# Patient Record
Sex: Female | Born: 1960 | Race: Black or African American | Hispanic: No | Marital: Married | Smoking: Never smoker
Health system: Southern US, Community
[De-identification: ages and names within clinical notes are randomized; demographics above are authoritative.]

## PROBLEM LIST (undated history)

## (undated) DIAGNOSIS — Z9109 Other allergy status, other than to drugs and biological substances: Secondary | ICD-10-CM

## (undated) DIAGNOSIS — R87619 Unspecified abnormal cytological findings in specimens from cervix uteri: Secondary | ICD-10-CM

## (undated) DIAGNOSIS — IMO0002 Reserved for concepts with insufficient information to code with codable children: Secondary | ICD-10-CM

## (undated) DIAGNOSIS — D869 Sarcoidosis, unspecified: Secondary | ICD-10-CM

## (undated) DIAGNOSIS — Z8742 Personal history of other diseases of the female genital tract: Secondary | ICD-10-CM

## (undated) DIAGNOSIS — C801 Malignant (primary) neoplasm, unspecified: Secondary | ICD-10-CM

## (undated) DIAGNOSIS — N809 Endometriosis, unspecified: Secondary | ICD-10-CM

## (undated) DIAGNOSIS — Z8719 Personal history of other diseases of the digestive system: Secondary | ICD-10-CM

## (undated) HISTORY — DX: Personal history of other diseases of the digestive system: Z87.19

## (undated) HISTORY — DX: Unspecified abnormal cytological findings in specimens from cervix uteri: R87.619

## (undated) HISTORY — DX: Reserved for concepts with insufficient information to code with codable children: IMO0002

## (undated) HISTORY — PX: LAPAROSCOPY: SHX197

## (undated) HISTORY — PX: CYSTECTOMY: SUR359

## (undated) HISTORY — DX: Other allergy status, other than to drugs and biological substances: Z91.09

## (undated) HISTORY — DX: Sarcoidosis, unspecified: D86.9

## (undated) HISTORY — DX: Personal history of other diseases of the female genital tract: Z87.42

## (undated) HISTORY — DX: Endometriosis, unspecified: N80.9

## (undated) HISTORY — DX: Malignant (primary) neoplasm, unspecified: C80.1

---

## 1990-04-01 HISTORY — PX: DILATION AND CURETTAGE OF UTERUS: SHX78

## 1997-10-17 ENCOUNTER — Other Ambulatory Visit: Admission: RE | Admit: 1997-10-17 | Discharge: 1997-10-17 | Payer: Self-pay | Admitting: Obstetrics & Gynecology

## 1999-03-30 ENCOUNTER — Other Ambulatory Visit: Admission: RE | Admit: 1999-03-30 | Discharge: 1999-03-30 | Payer: Self-pay | Admitting: Obstetrics & Gynecology

## 2000-09-10 ENCOUNTER — Other Ambulatory Visit: Admission: RE | Admit: 2000-09-10 | Discharge: 2000-09-10 | Payer: Self-pay | Admitting: Obstetrics and Gynecology

## 2002-01-20 ENCOUNTER — Other Ambulatory Visit: Admission: RE | Admit: 2002-01-20 | Discharge: 2002-01-20 | Payer: Self-pay | Admitting: Obstetrics and Gynecology

## 2003-04-13 ENCOUNTER — Other Ambulatory Visit: Admission: RE | Admit: 2003-04-13 | Discharge: 2003-04-13 | Payer: Self-pay | Admitting: Obstetrics and Gynecology

## 2003-08-09 ENCOUNTER — Ambulatory Visit (HOSPITAL_COMMUNITY): Admission: RE | Admit: 2003-08-09 | Discharge: 2003-08-09 | Payer: Self-pay

## 2004-06-21 ENCOUNTER — Ambulatory Visit: Payer: Self-pay | Admitting: Unknown Physician Specialty

## 2004-06-27 ENCOUNTER — Ambulatory Visit: Payer: Self-pay | Admitting: Unknown Physician Specialty

## 2004-07-04 ENCOUNTER — Ambulatory Visit: Payer: Self-pay | Admitting: Unknown Physician Specialty

## 2004-08-06 ENCOUNTER — Other Ambulatory Visit: Payer: Self-pay

## 2004-08-06 ENCOUNTER — Ambulatory Visit: Payer: Self-pay | Admitting: Specialist

## 2004-08-10 ENCOUNTER — Ambulatory Visit: Payer: Self-pay | Admitting: Specialist

## 2005-06-19 ENCOUNTER — Other Ambulatory Visit: Admission: RE | Admit: 2005-06-19 | Discharge: 2005-06-19 | Payer: Self-pay | Admitting: Obstetrics and Gynecology

## 2005-08-14 ENCOUNTER — Ambulatory Visit: Payer: Self-pay | Admitting: Specialist

## 2007-05-15 IMAGING — CT CT CHEST W/ CM
2 series · 15 of 31 positions shown, 19 images · IV contrast (agent unspecified)
Comparison: none

REASON FOR EXAM: Sarcoidosis
COMMENTS:

PROCEDURE:     CT  - CT CHEST WITH CONTRAST  - August 14, 2005  [DATE]
RESULT:     The patient has a history of sarcoidosis.
TECHNIQUE: CT scan of chest is performed with contrast.
Comparison is made to a prior chest CT of 07/04/2004.

[Series 3: soft tissue · axial · 0.75mm/px · z∈[+240,+290]mm · 2 of 62 slices shown]
[im 5/62  mediastinal]
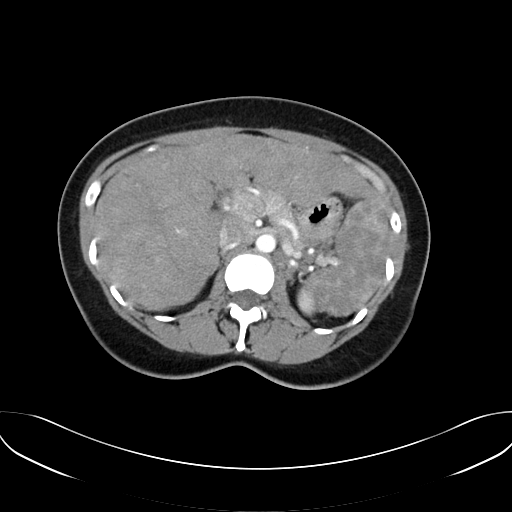
[im 15/62  mediastinal]
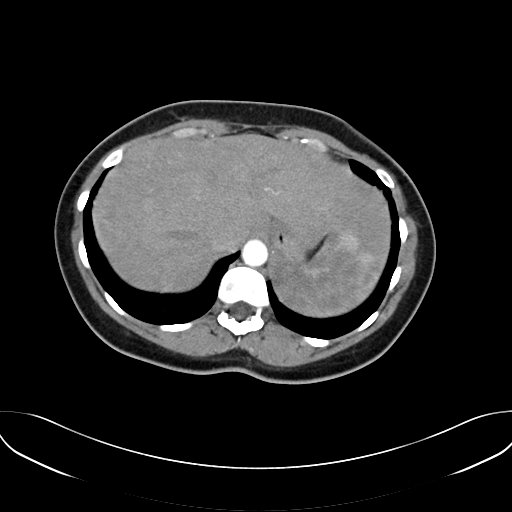

[Series 4: lung windows · axial · 0.75mm/px · z∈[+250,+500]mm · 13 of 60 slices shown, 17 images]
[im 5/60  mediastinal]
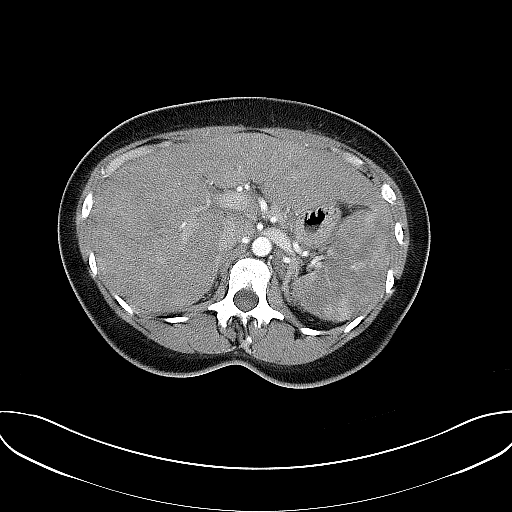
[im 5/60  lung]
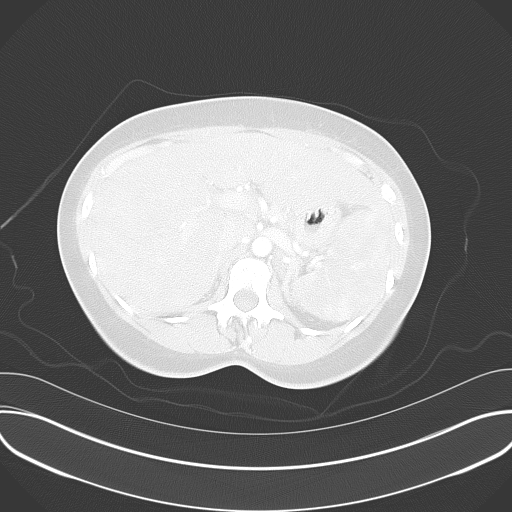
[im 10/60  lung]
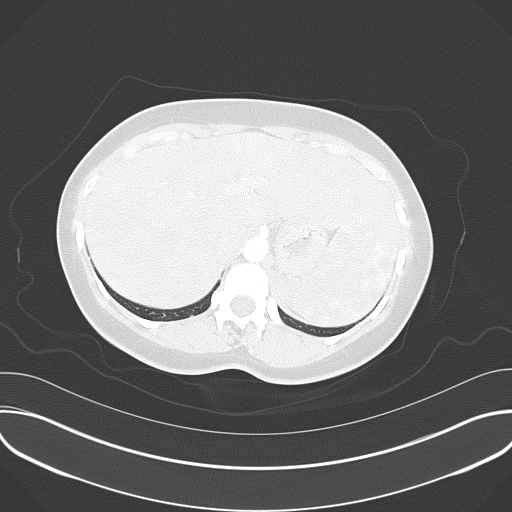
[im 14/60  lung]
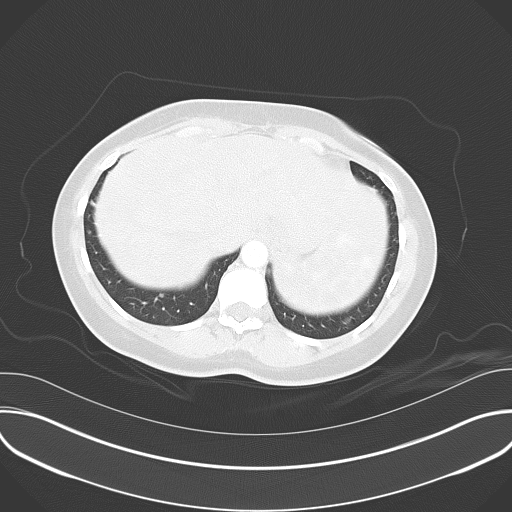
[im 19/60  lung]
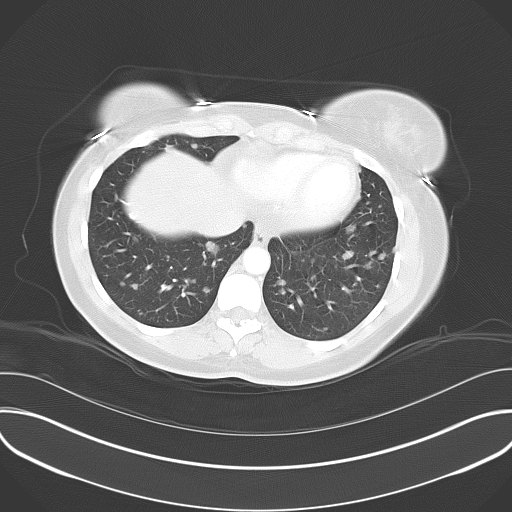
[im 23/60  mediastinal]
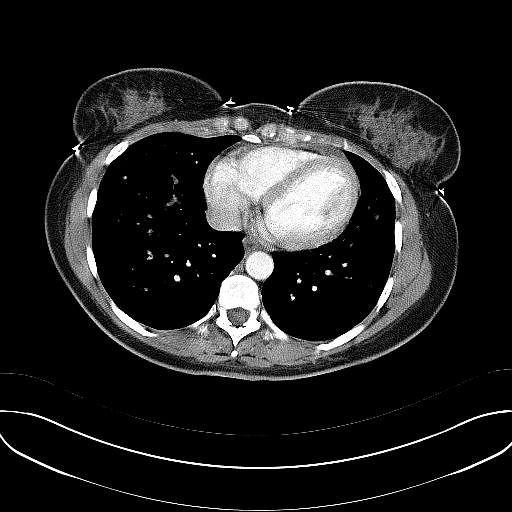
[im 23/60  lung]
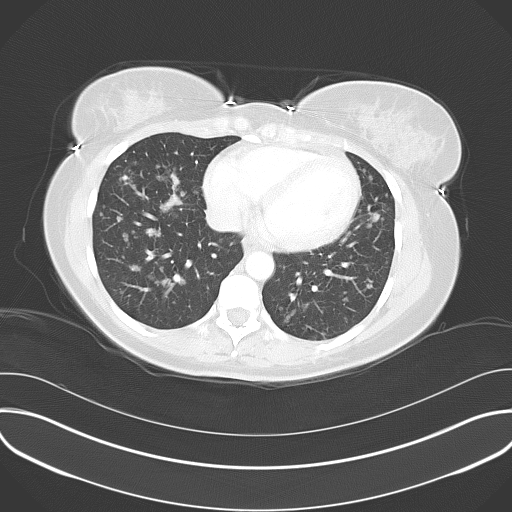
[im 28/60  lung]
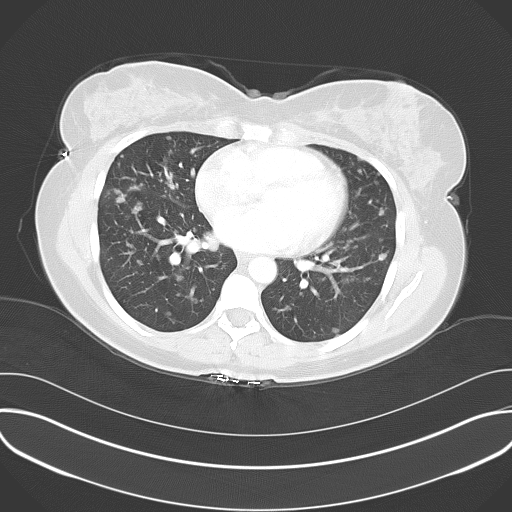
[im 30/60  lung]
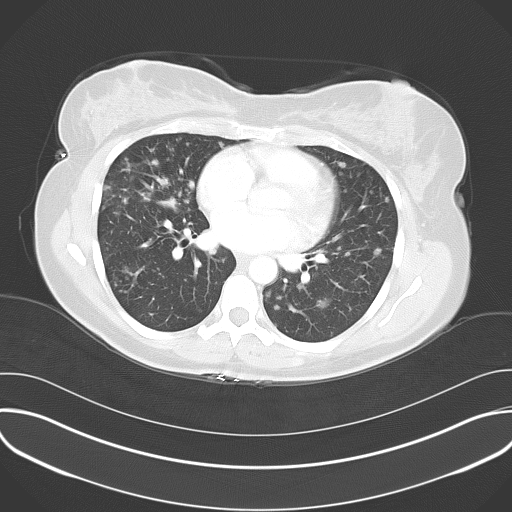
[im 32/60  lung]
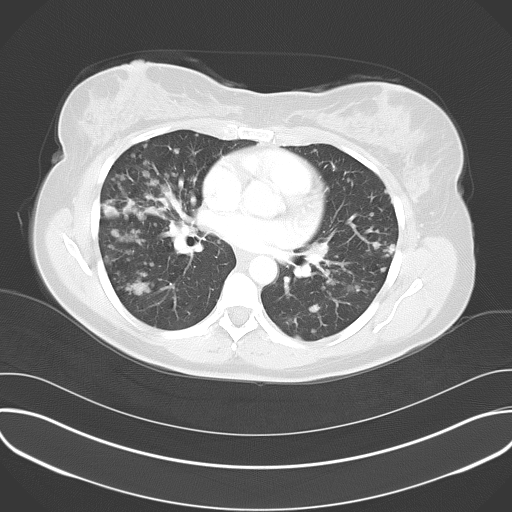
[im 37/60  mediastinal]
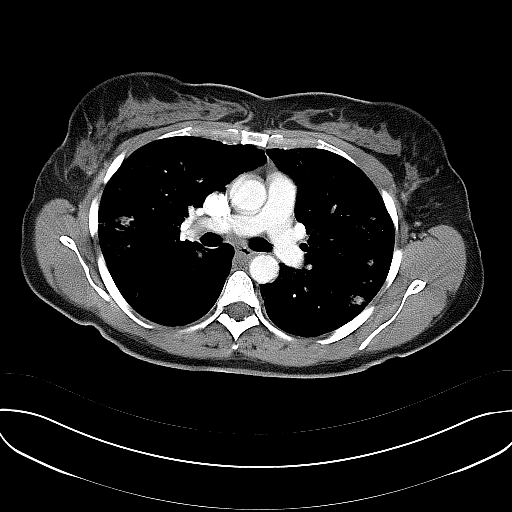
[im 37/60  lung]
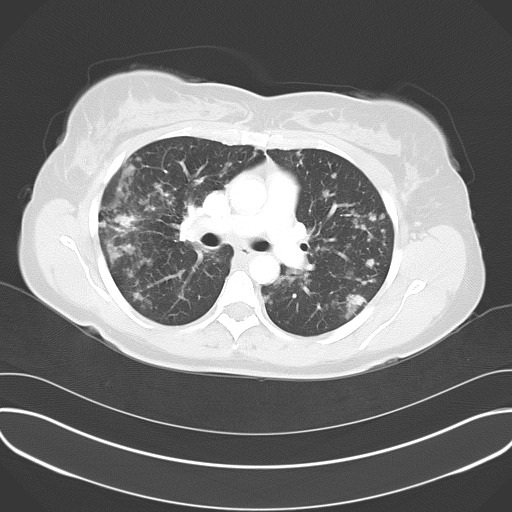
[im 41/60  lung]
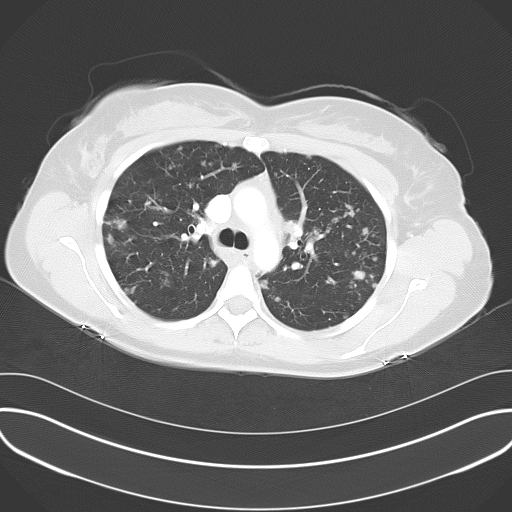
[im 46/60  lung]
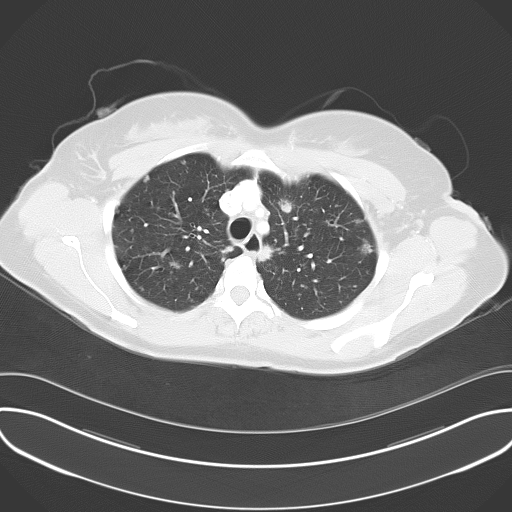
[im 50/60  lung]
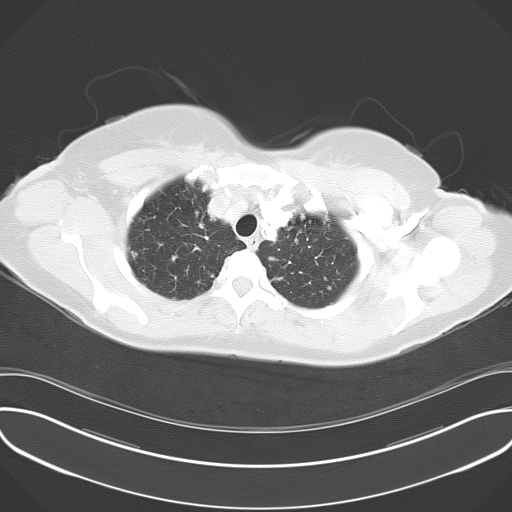
[im 55/60  mediastinal]
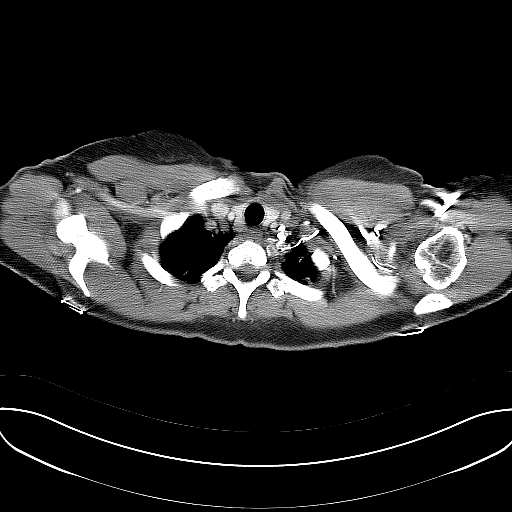
[im 55/60  lung]
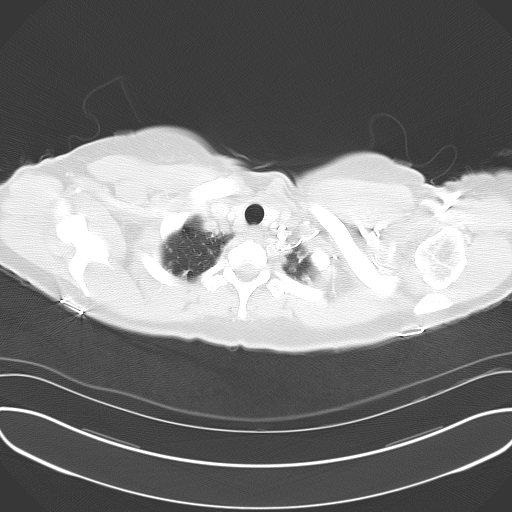

[15 of 31 positions shown; findings below may reference images not displayed]

FINDINGS: There are noted multiple, bilateral pulmonary nodules. The
pulmonary nodules have increased in size and number from the prior exam. An
alveolar component may be present. No pathologic mediastinal or hilar
lymphadenopathy is noted using CT size criteria. Innumerable splenic lesions
are noted. The patient has a known history of sarcoidosis and the above
described changes could be related to sarcoidosis. Superimposed infection or
other granulomatous disease cannot be excluded nor could other pulmonary
inflammatory diseases. Malignancy also cannot be excluded. This would
include metastatic disease.
IMPRESSION: Findings consistent with progressive sarcoidosis given the
patient's history.

## 2008-12-21 ENCOUNTER — Ambulatory Visit (HOSPITAL_COMMUNITY): Admission: RE | Admit: 2008-12-21 | Discharge: 2008-12-21 | Payer: Self-pay | Admitting: Obstetrics and Gynecology

## 2008-12-21 ENCOUNTER — Encounter (INDEPENDENT_AMBULATORY_CARE_PROVIDER_SITE_OTHER): Payer: Self-pay | Admitting: Obstetrics and Gynecology

## 2008-12-28 ENCOUNTER — Ambulatory Visit: Admission: RE | Admit: 2008-12-28 | Discharge: 2008-12-28 | Payer: Self-pay | Admitting: Gynecologic Oncology

## 2008-12-30 DIAGNOSIS — Z8541 Personal history of malignant neoplasm of cervix uteri: Secondary | ICD-10-CM | POA: Insufficient documentation

## 2009-02-15 ENCOUNTER — Encounter (INDEPENDENT_AMBULATORY_CARE_PROVIDER_SITE_OTHER): Payer: Self-pay | Admitting: Obstetrics and Gynecology

## 2009-02-15 ENCOUNTER — Ambulatory Visit (HOSPITAL_COMMUNITY): Admission: RE | Admit: 2009-02-15 | Discharge: 2009-02-16 | Payer: Self-pay | Admitting: Obstetrics and Gynecology

## 2009-04-01 DIAGNOSIS — C801 Malignant (primary) neoplasm, unspecified: Secondary | ICD-10-CM

## 2009-04-01 HISTORY — PX: ABDOMINAL HYSTERECTOMY: SHX81

## 2009-04-01 HISTORY — DX: Malignant (primary) neoplasm, unspecified: C80.1

## 2010-07-04 LAB — CBC
HCT: 37.3 % (ref 36.0–46.0)
HCT: 39 % (ref 36.0–46.0)
Hemoglobin: 12.8 g/dL (ref 12.0–15.0)
MCHC: 34.3 g/dL (ref 30.0–36.0)
MCV: 93 fL (ref 78.0–100.0)
Platelets: 256 10*3/uL (ref 150–400)
RDW: 13.9 % (ref 11.5–15.5)
RDW: 13.9 % (ref 11.5–15.5)
WBC: 11.7 10*3/uL — ABNORMAL HIGH (ref 4.0–10.5)

## 2010-07-04 LAB — COMPREHENSIVE METABOLIC PANEL
ALT: 57 U/L — ABNORMAL HIGH (ref 0–35)
Alkaline Phosphatase: 141 U/L — ABNORMAL HIGH (ref 39–117)
Calcium: 8.6 mg/dL (ref 8.4–10.5)
Chloride: 106 mEq/L (ref 96–112)
Creatinine, Ser: 0.66 mg/dL (ref 0.4–1.2)
GFR calc Af Amer: >60 mL/min (ref >60)
GFR calc non Af Amer: >60 mL/min (ref >60)
Potassium: 3.3 mEq/L — ABNORMAL LOW (ref 3.5–5.1)
Sodium: 135 mEq/L (ref 135–145)
Total Protein: 6.7 g/dL (ref 6.0–8.3)

## 2010-07-04 LAB — DIFFERENTIAL
Basophils Relative: 1 % (ref 0–1)
Lymphocytes Relative: 23 % (ref 12–46)
Lymphs Abs: 1.3 10*3/uL (ref 0.7–4.0)
Neutrophils Relative %: 64 % (ref 43–77)

## 2010-07-06 LAB — CBC
Hemoglobin: 14.2 g/dL (ref 12.0–15.0)
MCHC: 33.6 g/dL (ref 30.0–36.0)
MCV: 92.8 fL (ref 78.0–100.0)
Platelets: 268 10*3/uL (ref 150–400)

## 2010-11-14 IMAGING — CR DG CHEST 2V
2 series · 2 of 2 positions shown · non-contrast
Comparison: None

CLINICAL DATA: Cervical cancer, preoperative examination for
hysterectomy.  History of sarcoidosis.

CHEST - 2 VIEW

[view not recorded (1 of 2)]
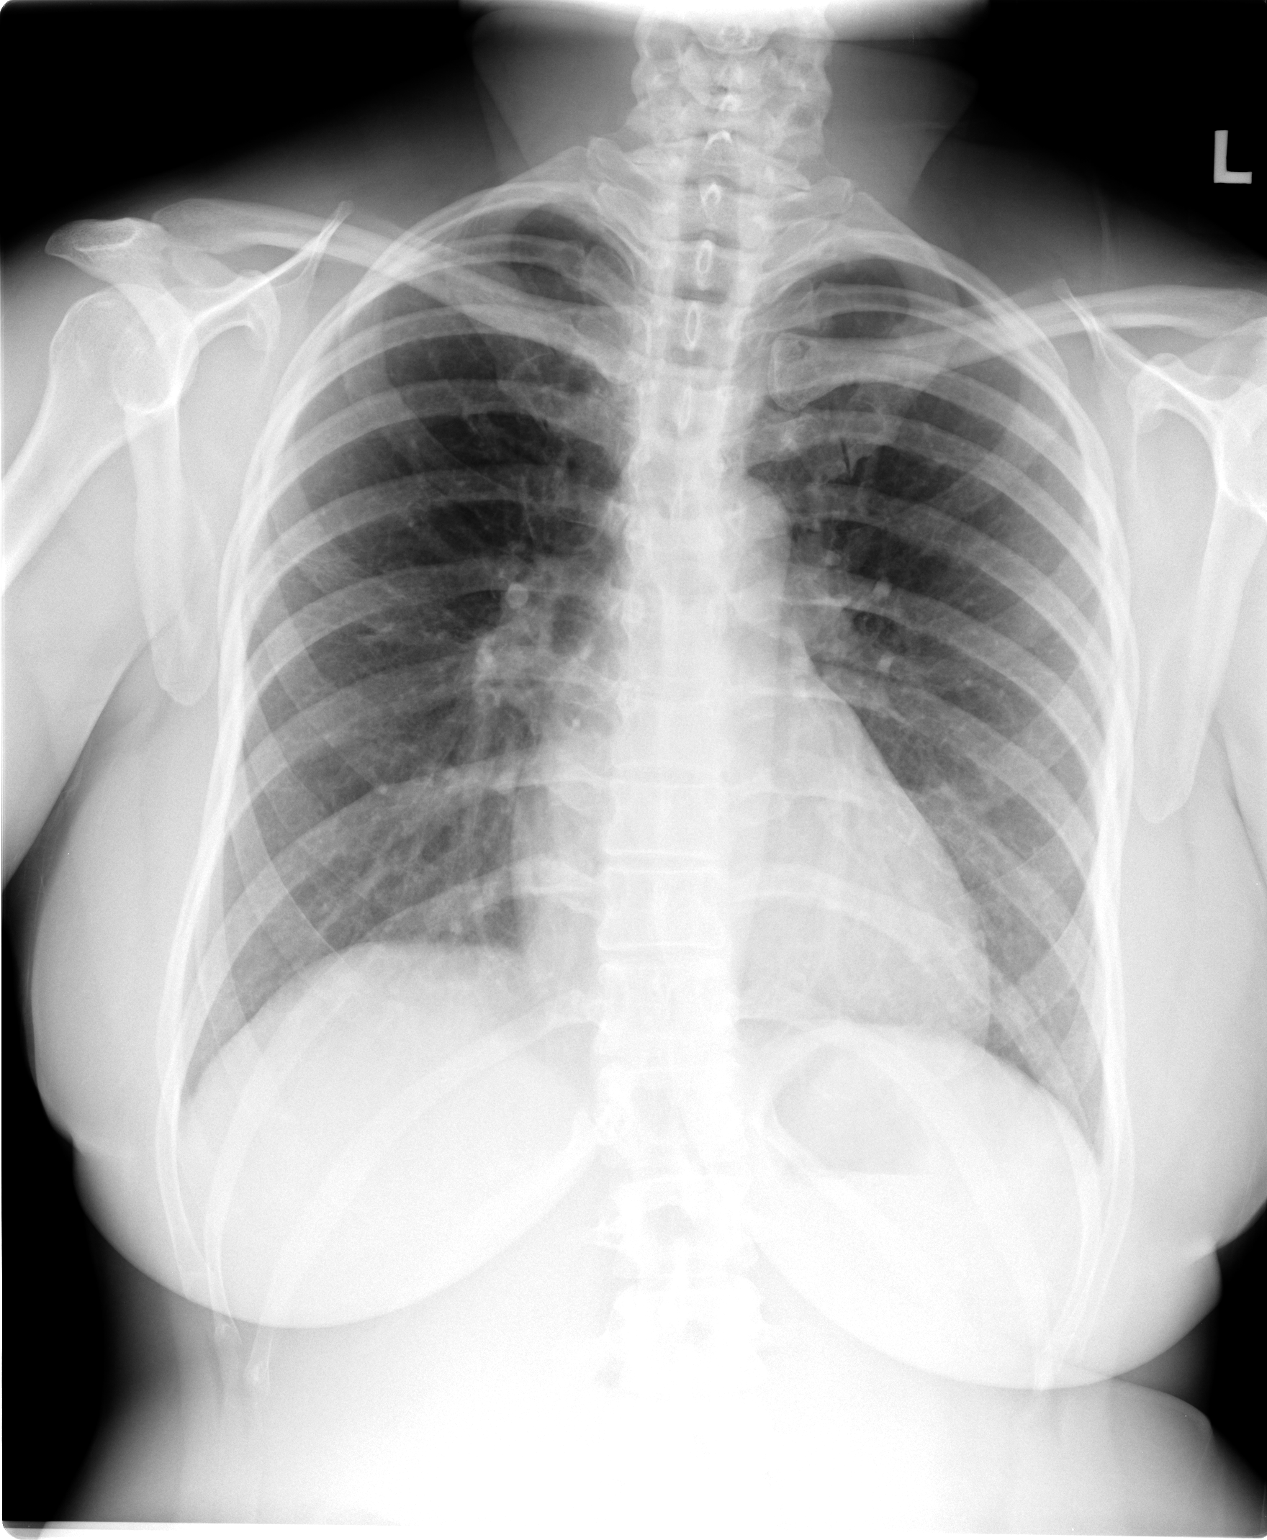

[view not recorded (2 of 2)]
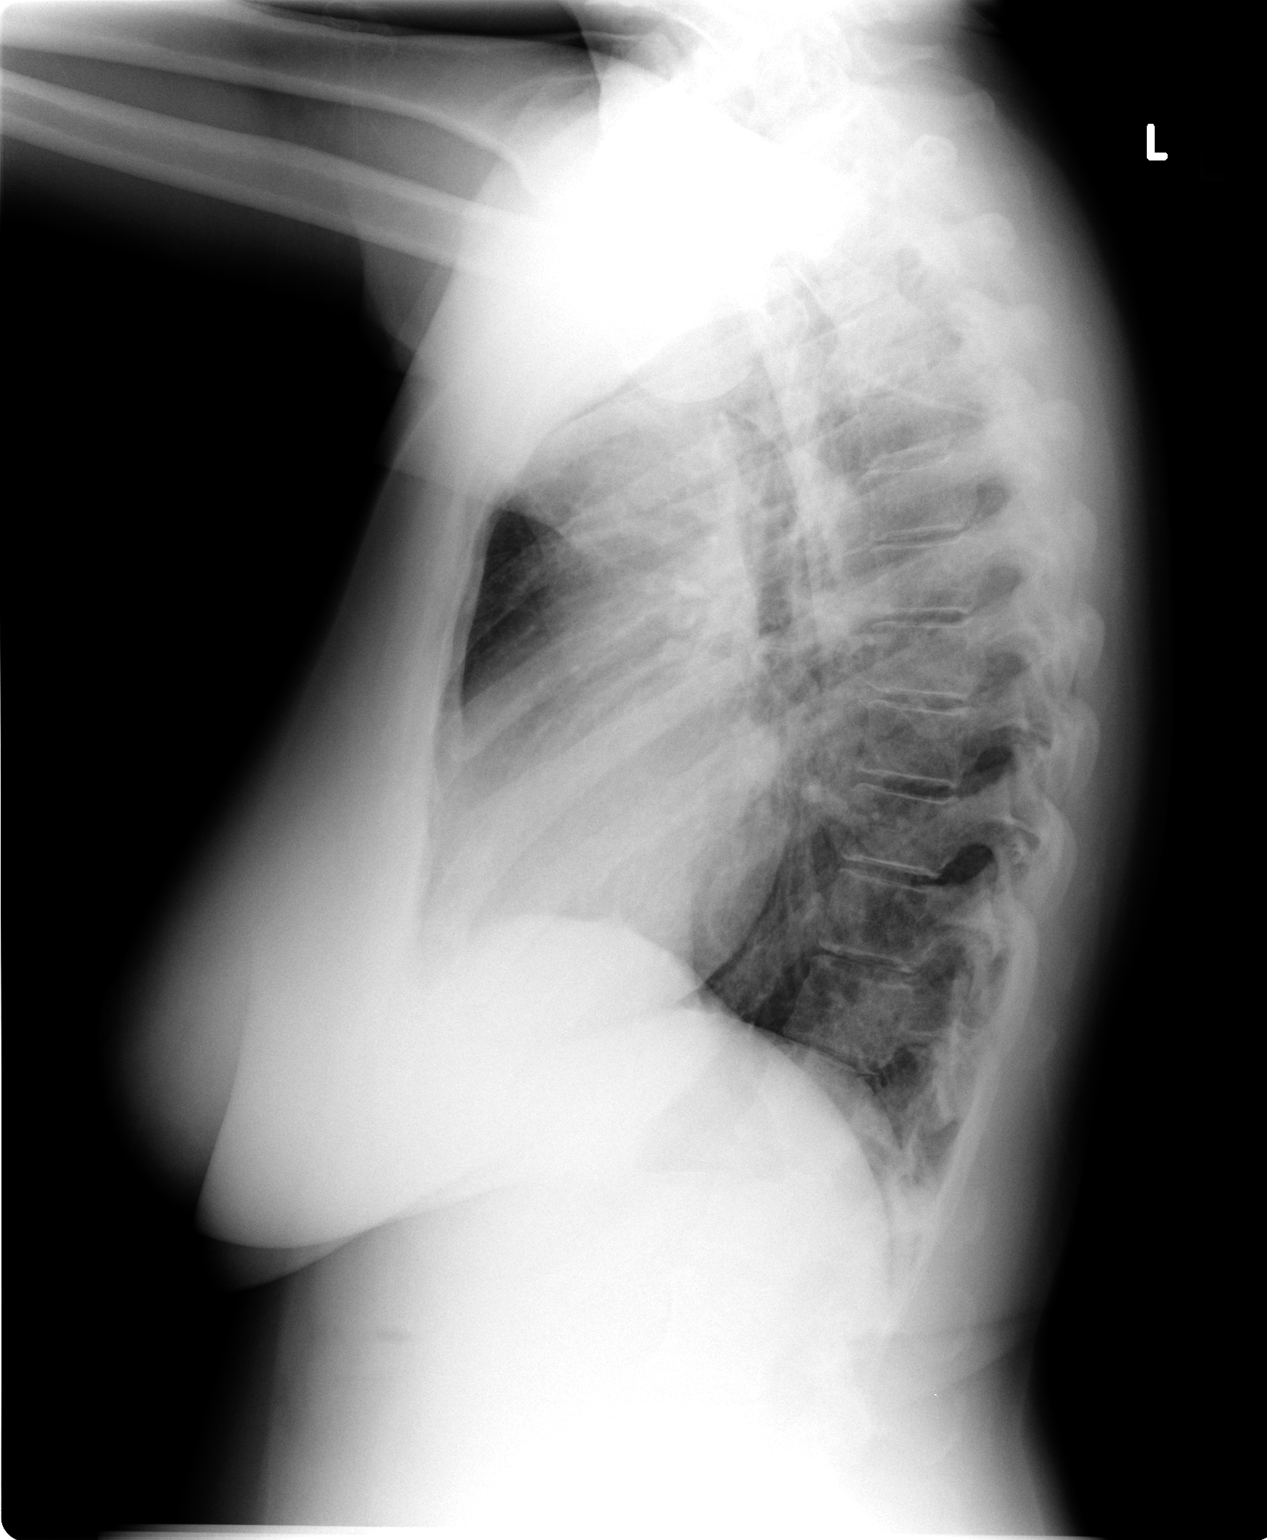

[2 of 2 positions shown; findings below may reference images not displayed]

FINDINGS: The heart size and mediastinal contours are within
normal limits.  Both lungs are clear.  The visualized skeletal
structures are unremarkable.
IMPRESSION: No active cardiopulmonary disease.

## 2011-05-29 ENCOUNTER — Ambulatory Visit: Payer: Managed Care, Other (non HMO) | Admitting: Obstetrics and Gynecology

## 2011-08-07 ENCOUNTER — Telehealth: Payer: Self-pay | Admitting: Obstetrics and Gynecology

## 2011-08-08 NOTE — Telephone Encounter (Signed)
Tc from pt. Appt sched 09-03-11@3 :45p with vph. Pt voices understanding. Pt denies any complaints at this time.

## 2011-08-08 NOTE — Telephone Encounter (Signed)
Lm on vm to cb per telephone call.  

## 2011-09-02 DIAGNOSIS — Z8742 Personal history of other diseases of the female genital tract: Secondary | ICD-10-CM | POA: Insufficient documentation

## 2011-09-02 DIAGNOSIS — N809 Endometriosis, unspecified: Secondary | ICD-10-CM | POA: Insufficient documentation

## 2011-09-02 DIAGNOSIS — Z9109 Other allergy status, other than to drugs and biological substances: Secondary | ICD-10-CM | POA: Insufficient documentation

## 2011-09-02 DIAGNOSIS — IMO0002 Reserved for concepts with insufficient information to code with codable children: Secondary | ICD-10-CM | POA: Insufficient documentation

## 2011-09-02 DIAGNOSIS — Z8719 Personal history of other diseases of the digestive system: Secondary | ICD-10-CM | POA: Insufficient documentation

## 2011-09-03 ENCOUNTER — Encounter: Payer: Managed Care, Other (non HMO) | Admitting: Obstetrics and Gynecology

## 2011-12-31 ENCOUNTER — Encounter: Payer: Self-pay | Admitting: Obstetrics and Gynecology

## 2011-12-31 ENCOUNTER — Ambulatory Visit (INDEPENDENT_AMBULATORY_CARE_PROVIDER_SITE_OTHER): Payer: Managed Care, Other (non HMO) | Admitting: Obstetrics and Gynecology

## 2011-12-31 VITALS — BP 122/68 | Ht 62.5 in | Wt 164.0 lb

## 2011-12-31 DIAGNOSIS — Z8541 Personal history of malignant neoplasm of cervix uteri: Secondary | ICD-10-CM

## 2011-12-31 DIAGNOSIS — C539 Malignant neoplasm of cervix uteri, unspecified: Secondary | ICD-10-CM

## 2011-12-31 NOTE — Patient Instructions (Signed)

## 2011-12-31 NOTE — Progress Notes (Signed)
Last Pap: 11/22/2010 WNL: Yes Regular Periods:no Contraception: Hysterectomy  Monthly Breast exam:no Tetanus<17yrs:yes Nl.Bladder Function:yes Daily BMs:yes Healthy Diet:yes Calcium:no Mammogram:yes Date of Mammogram: 02/25/2011 Exercise:yes Have often Exercise: 3 times weekly Seatbelt: yes Abuse at home: no Stressful work:yes Sigmoid-colonoscopy: 2010 Bone Density: No PCP: Dr. Viann Shove Change in PMH: None Change in [facility]: None  Subjective:    NEZIAH French is a 51 y.o. female G3P2 who presents for annual exam.  The patient has no complaints today except mild vasomotor sx.  The following portions of the patient's history were reviewed and updated as appropriate: allergies, current medications, past family history, past medical history, past social history, past surgical history and problem list.  Review of Systems Pertinent items are noted in HPI. Gastrointestinal:No change in bowel habits, no abdominal pain, no rectal bleeding Genitourinary:negative for dysuria, frequency, hematuria, nocturia and urinary incontinence    Objective:     BP 122/68  Ht 5' 2.5" (1.588 m)  Wt 164 lb (74.39 kg)  BMI 29.52 kg/m2  Weight:  Wt Readings from Last 1 Encounters:  12/31/11 164 lb (74.39 kg)     BMI: Body mass index is 29.52 kg/(m^2). General Appearance: Alert, appropriate appearance for age. No acute distress HEENT: Grossly normal Neck / Thyroid: Supple, no masses, nodes or enlargement Lungs: clear to auscultation bilaterally Back: No CVA tenderness Breast Exam: No masses or nodes.No dimpling, nipple retraction or discharge. Cardiovascular: Regular rate and rhythm. S1, S2, no murmur Gastrointestinal: Soft, non-tender, no masses or organomegaly Pelvic Exam: External genitalia: normal general appearance Vaginal: normal rugae and vaginal vault, Well suspended Cervix: removed surgically Adnexa: non palpable Uterus: removed surgically Rectovaginal: normal rectal, no  masses Lymphatic Exam: Non-palpable nodes in neck, clavicular, axillary, or inguinal regions Skin: no rash or abnormalities Neurologic: Normal gait and speech, no tremor  Psychiatric: Alert and oriented, appropriate affect.    Urinalysis:Not done    Assessment:    s/p TVH for cervical cancer  Mild vasomotor sx   Plan:   mammogram pap smear return annually or prn Follow-up:  in 6 month(s) for pap

## 2012-01-02 LAB — PAP IG W/ RFLX HPV ASCU

## 2012-06-01 ENCOUNTER — Ambulatory Visit: Payer: Self-pay | Admitting: Internal Medicine

## 2012-06-19 ENCOUNTER — Ambulatory Visit: Payer: Self-pay | Admitting: Internal Medicine

## 2012-07-23 ENCOUNTER — Encounter: Payer: Self-pay | Admitting: *Deleted

## 2012-08-10 ENCOUNTER — Ambulatory Visit (INDEPENDENT_AMBULATORY_CARE_PROVIDER_SITE_OTHER): Payer: Managed Care, Other (non HMO) | Admitting: Internal Medicine

## 2012-08-10 ENCOUNTER — Encounter: Payer: Self-pay | Admitting: Internal Medicine

## 2012-08-10 VITALS — BP 120/70 | HR 75 | Temp 97.8°F | Ht 62.0 in | Wt 165.0 lb

## 2012-08-10 DIAGNOSIS — C539 Malignant neoplasm of cervix uteri, unspecified: Secondary | ICD-10-CM

## 2012-08-10 DIAGNOSIS — Z13220 Encounter for screening for lipoid disorders: Secondary | ICD-10-CM

## 2012-08-10 DIAGNOSIS — Z9109 Other allergy status, other than to drugs and biological substances: Secondary | ICD-10-CM

## 2012-08-10 DIAGNOSIS — D869 Sarcoidosis, unspecified: Secondary | ICD-10-CM

## 2012-08-10 NOTE — Progress Notes (Signed)
  Subjective:    Patient ID: Diane French, female    DOB: 08-Mar-1961, 52 y.o.   MRN: [id number]  HPI 52 year old female with past history of allergies, sarcoidosis and cervical cancer.  She comes in today to follow up on these issues as well as to establish care.  She is followed by [facility] - [facility]. History of cervical cancer.  Had surgery and required no other treatment.  Also gets her mammograms through [place].  Sees Dr Meredeth Ide for sarcoid.  No sob.  No increased cough or congestion.  No chest pain or tightness.  Sees Dr [place] Callas.  Planning to start allergy injections.  Previous colonoscopy - ok.    Past Medical History  Diagnosis Date  . Hx of dysmenorrhea   . Abnormal Pap smear     HSIL  . Endometriosis   . H/O hemorrhoids   . Sarcoidosis   . Cancer 2011    cervical cancer  . Environmental allergies     Outpatient Encounter Prescriptions as of 08/10/2012  Medication Sig Dispense Refill  . levocetirizine (XYZAL) 5 MG tablet Take 5 mg by mouth every evening.       No facility-administered encounter medications on file as of 08/10/2012.    Review of Systems Patient denies any headache, lightheadedness or dizziness.  No significant sinus symptoms now.  No chest pain, tightness or palpitations.  No increased shortness of breath, cough or congestion. No acid reflux.  No nausea or vomiting.  No abdominal pain or cramping.  No bowel change, such as diarrhea, constipation, BRBPR or melana.  No urine change.  Planning to start allergy injections.  Sees Dr [place] Callas.  On xyzal.  Colonoscopy approximately five years - ok.  Up to date with pap/pelvic exams.  Just received notification - due for her mammogram.       Objective:   Physical Exam Filed Vitals:   08/10/12 1608  BP: 120/70  Pulse: 75  Temp: 97.8 F (67.50 C)   53 year old female in no acute distress.   HEENT:  Nares- clear.  Oropharynx - without lesions. NECK:  Supple.  Nontender.  No audible bruit.  SKIN:  Well healed  excision site right lateral neck.  HEART:  Appears to be regular. LUNGS:  No crackles or wheezing audible.  Respirations even and unlabored.  RADIAL PULSE:  Equal bilaterally.  ABDOMEN:  Soft, nontender.  Bowel sounds present and normal.  No audible abdominal bruit.   EXTREMITIES:  No increased edema present.  DP pulses palpable and equal bilaterally.          Assessment & Plan:  FATIGUE.  Did report some fatigue.  Overall doing well.  Check cbc, met c and tsh.   HEALTH MAINTENANCE.  Up to date with pelvic/pap smears and breast exams.  Due for her mammogram.  Planning to schedule.  Colonoscopy approximately five years ago.  Negative.   I spent 45 minutes regarding the patient and visit with more than 50% of the time spent in consultation regarding the above.

## 2012-08-10 NOTE — Assessment & Plan Note (Signed)
Sees Dr [place] Callas.  Planning to start allergy injections.  On xyzal.

## 2012-08-10 NOTE — Assessment & Plan Note (Signed)
S/p hysterectomy.  Followed by GYN.

## 2012-08-10 NOTE — Assessment & Plan Note (Signed)
Followed by Dr Meredeth Ide.  Obtain records. Currently doing well.

## 2012-08-25 ENCOUNTER — Other Ambulatory Visit (INDEPENDENT_AMBULATORY_CARE_PROVIDER_SITE_OTHER): Payer: Managed Care, Other (non HMO)

## 2012-08-25 DIAGNOSIS — D869 Sarcoidosis, unspecified: Secondary | ICD-10-CM

## 2012-08-25 DIAGNOSIS — Z13220 Encounter for screening for lipoid disorders: Secondary | ICD-10-CM

## 2012-08-25 LAB — CBC WITH DIFFERENTIAL/PLATELET
Basophils Absolute: 0 10*3/uL (ref 0.0–0.1)
Basophils Relative: 0.7 % (ref 0.0–3.0)
Eosinophils Relative: 4 % (ref 0.0–5.0)
HCT: 40.9 % (ref 36.0–46.0)
Lymphs Abs: 2 10*3/uL (ref 0.7–4.0)
MCV: 90 fl (ref 78.0–100.0)
Monocytes Absolute: 0.6 10*3/uL (ref 0.1–1.0)
Platelets: 248 10*3/uL (ref 150.0–400.0)
RBC: 4.55 Mil/uL (ref 3.87–5.11)
WBC: 6.7 10*3/uL (ref 4.5–10.5)

## 2012-08-25 LAB — LIPID PANEL: Total CHOL/HDL Ratio: 2

## 2012-08-25 LAB — COMPREHENSIVE METABOLIC PANEL
ALT: 42 U/L — ABNORMAL HIGH (ref 0–35)
AST: 37 U/L (ref 0–37)
Albumin: 3.7 g/dL (ref 3.5–5.2)
Calcium: 8.9 mg/dL (ref 8.4–10.5)
Creatinine, Ser: 0.8 mg/dL (ref 0.4–1.2)
Sodium: 140 mEq/L (ref 135–145)

## 2012-08-26 ENCOUNTER — Other Ambulatory Visit: Payer: Self-pay | Admitting: Internal Medicine

## 2012-08-26 ENCOUNTER — Encounter: Payer: Self-pay | Admitting: *Deleted

## 2012-08-26 DIAGNOSIS — R945 Abnormal results of liver function studies: Secondary | ICD-10-CM

## 2012-08-26 DIAGNOSIS — R7989 Other specified abnormal findings of blood chemistry: Secondary | ICD-10-CM

## 2012-08-26 NOTE — Progress Notes (Signed)
Follow up liver panel ordered.

## 2012-09-08 ENCOUNTER — Other Ambulatory Visit: Payer: Managed Care, Other (non HMO)

## 2012-09-09 ENCOUNTER — Other Ambulatory Visit (INDEPENDENT_AMBULATORY_CARE_PROVIDER_SITE_OTHER): Payer: Managed Care, Other (non HMO)

## 2012-09-09 DIAGNOSIS — R7989 Other specified abnormal findings of blood chemistry: Secondary | ICD-10-CM

## 2012-09-09 DIAGNOSIS — R945 Abnormal results of liver function studies: Secondary | ICD-10-CM

## 2012-09-10 LAB — HEPATIC FUNCTION PANEL
AST: 29 U/L (ref 0–37)
Bilirubin, Direct: 0.1 mg/dL (ref 0.0–0.3)
Total Bilirubin: 1 mg/dL (ref 0.3–1.2)
Total Protein: 7.2 g/dL (ref 6.0–8.3)

## 2012-09-11 ENCOUNTER — Encounter: Payer: Self-pay | Admitting: Family Medicine

## 2012-09-16 ENCOUNTER — Encounter: Payer: Self-pay | Admitting: *Deleted

## 2012-09-16 ENCOUNTER — Other Ambulatory Visit: Payer: Self-pay | Admitting: Internal Medicine

## 2012-09-16 DIAGNOSIS — R748 Abnormal levels of other serum enzymes: Secondary | ICD-10-CM

## 2012-09-16 NOTE — Progress Notes (Signed)
Order placed for f/u liver panel.  

## 2012-10-15 ENCOUNTER — Encounter: Payer: Self-pay | Admitting: Internal Medicine

## 2013-02-11 ENCOUNTER — Ambulatory Visit: Payer: Managed Care, Other (non HMO) | Admitting: Internal Medicine

## 2013-02-26 ENCOUNTER — Encounter: Payer: Self-pay | Admitting: Internal Medicine

## 2013-02-26 ENCOUNTER — Ambulatory Visit (INDEPENDENT_AMBULATORY_CARE_PROVIDER_SITE_OTHER): Payer: Managed Care, Other (non HMO) | Admitting: Internal Medicine

## 2013-02-26 VITALS — BP 120/90 | HR 99 | Temp 97.5°F | Wt 158.8 lb

## 2013-02-26 DIAGNOSIS — D869 Sarcoidosis, unspecified: Secondary | ICD-10-CM

## 2013-02-26 DIAGNOSIS — R6889 Other general symptoms and signs: Secondary | ICD-10-CM

## 2013-02-26 DIAGNOSIS — Z23 Encounter for immunization: Secondary | ICD-10-CM

## 2013-02-26 DIAGNOSIS — Z9109 Other allergy status, other than to drugs and biological substances: Secondary | ICD-10-CM

## 2013-02-26 DIAGNOSIS — R748 Abnormal levels of other serum enzymes: Secondary | ICD-10-CM

## 2013-02-26 DIAGNOSIS — IMO0002 Reserved for concepts with insufficient information to code with codable children: Secondary | ICD-10-CM

## 2013-02-26 DIAGNOSIS — N809 Endometriosis, unspecified: Secondary | ICD-10-CM

## 2013-02-26 DIAGNOSIS — C539 Malignant neoplasm of cervix uteri, unspecified: Secondary | ICD-10-CM

## 2013-02-26 LAB — HEPATIC FUNCTION PANEL
AST: 30 U/L (ref 0–37)
Bilirubin, Direct: 0 mg/dL (ref 0.0–0.3)

## 2013-02-26 NOTE — Progress Notes (Signed)
Pre-visit discussion using our clinic review tool. No additional management support is needed unless otherwise documented below in the visit note.  

## 2013-02-26 NOTE — Progress Notes (Signed)
  Subjective:    Patient ID: Diane French, female    DOB: [birth year], 52 y.o.   MRN: [id number]  HPI 52 year old female with past history of allergies, sarcoidosis and cervical cancer.  She comes in today for a scheduled follow up.  She is followed by [facility]. History of cervical cancer.  Had surgery and required no other treatment.  Also gets her mammograms through [place].  Sees Dr Meredeth Ide for sarcoid.  No sob.  No increased cough or congestion.  No chest pain or tightness.  Sees Dr [place] Callas.  On xyzal.  Doing well with this.  Allergies controlled.  Has f/u with Dr [place] Callas 1-2/15.   Previous colonoscopy - ok.    Past Medical History  Diagnosis Date  . Hx of dysmenorrhea   . Abnormal Pap smear     HSIL  . Endometriosis   . H/O hemorrhoids   . Sarcoidosis   . Cancer 2011    cervical cancer  . Environmental allergies     Outpatient Encounter Prescriptions as of 02/26/2013  Medication Sig  . levocetirizine (XYZAL) 5 MG tablet Take 5 mg by mouth every evening.    Review of Systems Patient denies any headache, lightheadedness or dizziness.  No significant sinus symptoms now. Allergies controlled on xyzal.   No chest pain, tightness or palpitations.  No increased shortness of breath, cough or congestion.  No acid reflux.  No nausea or vomiting.  No abdominal pain or cramping.  No bowel change, such as diarrhea, constipation, BRBPR or melana.  No urine change.  Planning to start allergy injections.  Sees Dr [place] Callas.  On xyzal.  Colonoscopy approximately five years - ok.  Up to date with pap/pelvic exams.        Objective:   Physical Exam  Filed Vitals:   02/26/13 1126  BP: 120/90  Pulse: 99  Temp: 97.5 F (36.4 C)   Blood pressure recheck:  45/6  52 year old female in no acute distress.   HEENT:  Nares- clear.  Oropharynx - without lesions. NECK:  Supple.  Nontender.  No audible bruit.  SKIN:  Well healed excision site right lateral neck.  HEART:  Appears to be  regular. LUNGS:  No crackles or wheezing audible.  Respirations even and unlabored.  RADIAL PULSE:  Equal bilaterally.  ABDOMEN:  Soft, nontender.  Bowel sounds present and normal.  No audible abdominal bruit.   EXTREMITIES:  No increased edema present.  DP pulses palpable and equal bilaterally.          Assessment & Plan:  HEALTH MAINTENANCE.  Up to date with pelvic/pap smears and breast exams.  States  Colonoscopy approximately five years ago.  Negative.  Mammogram 09/21/12 - [facility].

## 2013-02-27 ENCOUNTER — Other Ambulatory Visit: Payer: Self-pay | Admitting: Internal Medicine

## 2013-02-27 DIAGNOSIS — R748 Abnormal levels of other serum enzymes: Secondary | ICD-10-CM

## 2013-02-27 NOTE — Progress Notes (Signed)
Order placed for f/u alkaline phosphatase

## 2013-02-28 ENCOUNTER — Encounter: Payer: Self-pay | Admitting: Internal Medicine

## 2013-02-28 DIAGNOSIS — R748 Abnormal levels of other serum enzymes: Secondary | ICD-10-CM | POA: Insufficient documentation

## 2013-02-28 NOTE — Assessment & Plan Note (Signed)
Followed by gyn

## 2013-02-28 NOTE — Assessment & Plan Note (Signed)
Will recheck today.  If persistent elevation, will fractionate.

## 2013-02-28 NOTE — Assessment & Plan Note (Signed)
Up to date with pap smears.  States last check wnl.

## 2013-02-28 NOTE — Assessment & Plan Note (Signed)
S/p hysterectomy.  Followed by GYN.  States up to date with pap smears.

## 2013-02-28 NOTE — Assessment & Plan Note (Signed)
Doing well on xyzal.  Sees Dr [place] Callas.

## 2013-02-28 NOTE — Assessment & Plan Note (Signed)
Followed by Dr Meredeth Ide.  Obtain records. Currently doing well.

## 2013-03-02 ENCOUNTER — Telehealth: Payer: Self-pay | Admitting: *Deleted

## 2013-03-02 NOTE — Telephone Encounter (Signed)
Left pt detailed voicemail on cell phone & asked pt to callback & schedule a non-fasting lab in 4 weeks.

## 2013-04-02 ENCOUNTER — Other Ambulatory Visit: Payer: Self-pay | Admitting: Internal Medicine

## 2013-04-02 ENCOUNTER — Encounter (INDEPENDENT_AMBULATORY_CARE_PROVIDER_SITE_OTHER): Payer: Self-pay

## 2013-04-02 ENCOUNTER — Telehealth: Payer: Self-pay | Admitting: *Deleted

## 2013-04-02 ENCOUNTER — Other Ambulatory Visit (INDEPENDENT_AMBULATORY_CARE_PROVIDER_SITE_OTHER): Payer: Managed Care, Other (non HMO)

## 2013-04-02 DIAGNOSIS — R748 Abnormal levels of other serum enzymes: Secondary | ICD-10-CM

## 2013-04-02 LAB — HEPATIC FUNCTION PANEL
ALBUMIN: 4.1 g/dL (ref 3.5–5.2)
ALK PHOS: 137 U/L — AB (ref 39–117)
ALT: 30 U/L (ref 0–35)
AST: 45 U/L — ABNORMAL HIGH (ref 0–37)
Bilirubin, Direct: 0.1 mg/dL (ref 0.0–0.3)
TOTAL PROTEIN: 7.4 g/dL (ref 6.0–8.3)
Total Bilirubin: 1.2 mg/dL (ref 0.3–1.2)

## 2013-04-02 NOTE — Telephone Encounter (Signed)
Pt.notified

## 2013-04-02 NOTE — Telephone Encounter (Signed)
Pt would like to know what milti-vitamin would you recommend?

## 2013-04-02 NOTE — Telephone Encounter (Signed)
Her hgb is normal.  After reviewing her chart, I don't see need for her to take a multivitamin.

## 2013-04-02 NOTE — Telephone Encounter (Signed)
I reviewed her chart and hgb is normal, etc.  I do not see need for her to take a multivitamin.

## 2013-04-07 ENCOUNTER — Telehealth: Payer: Self-pay | Admitting: Internal Medicine

## 2013-04-07 DIAGNOSIS — R945 Abnormal results of liver function studies: Secondary | ICD-10-CM

## 2013-04-07 DIAGNOSIS — R7989 Other specified abnormal findings of blood chemistry: Secondary | ICD-10-CM

## 2013-04-07 NOTE — Telephone Encounter (Signed)
Pt called back and agreed to abdominal ultrasound given persistent elevation in her liver enzymes.  Order placed for abdominal US

## 2013-04-09 LAB — ALKALINE PHOSPHATASE ISOENZYMES
ALKALINE PHOSPHATASE (ALP ISO): 186 U/L — AB (ref 33–130)
Bone Isoenzymes: 11 % — ABNORMAL LOW (ref 28–66)
Intestinal Isoenzymes: 5 % (ref 1–24)
Liver Isoenzymes: 70 % — ABNORMAL HIGH (ref 25–69)
MACROHEPATIC ISOENZYMES: 14 % — AB

## 2013-04-19 ENCOUNTER — Telehealth: Payer: Self-pay | Admitting: Emergency Medicine

## 2013-04-19 NOTE — Telephone Encounter (Signed)
Error

## 2013-04-21 ENCOUNTER — Ambulatory Visit: Payer: Self-pay | Admitting: Internal Medicine

## 2013-04-23 ENCOUNTER — Ambulatory Visit: Payer: Managed Care, Other (non HMO) | Admitting: Internal Medicine

## 2013-04-26 ENCOUNTER — Telehealth: Payer: Self-pay | Admitting: Internal Medicine

## 2013-04-26 NOTE — Telephone Encounter (Signed)
The patient is calling for her ABD ultra sound results.

## 2013-04-27 NOTE — Telephone Encounter (Signed)
I have not seen the results in my basket, please let me know if I need to request anything.

## 2013-04-28 ENCOUNTER — Telehealth: Payer: Self-pay | Admitting: Internal Medicine

## 2013-04-28 DIAGNOSIS — R9389 Abnormal findings on diagnostic imaging of other specified body structures: Secondary | ICD-10-CM

## 2013-04-28 DIAGNOSIS — R7989 Other specified abnormal findings of blood chemistry: Secondary | ICD-10-CM

## 2013-04-28 DIAGNOSIS — R945 Abnormal results of liver function studies: Secondary | ICD-10-CM

## 2013-04-28 NOTE — Telephone Encounter (Signed)
Pt notified of ultrasound results and the need for GI referral.  Order placed for referral.

## 2013-04-28 NOTE — Telephone Encounter (Signed)
Order placed for gastroenterology referral

## 2013-05-03 ENCOUNTER — Telehealth: Payer: Self-pay | Admitting: Internal Medicine

## 2013-05-03 NOTE — Telephone Encounter (Signed)
I would recommend [facility] for further evaluation.  Let me know if agreeable and I will place order for the referral.

## 2013-05-03 NOTE — Telephone Encounter (Signed)
The patient was referred to a Gastroenterologist in [place] and he is out of network for the patient . She is wanting a call back from Dr. Nicki Reaper to discuss some other GI specialist that Dr. Nicki Reaper would prefer and that are in network .

## 2013-05-05 NOTE — Telephone Encounter (Signed)
The patient was returning your call

## 2013-05-05 NOTE — Telephone Encounter (Signed)
Left detailed voicemail

## 2013-05-07 ENCOUNTER — Telehealth: Payer: Self-pay | Admitting: Internal Medicine

## 2013-05-07 NOTE — Telephone Encounter (Signed)
Noted  

## 2013-05-07 NOTE — Telephone Encounter (Signed)
Pt came in today wanting dr scott to call her about her ultra sound of abd.  She had more questions about this test.  She has appointment 06/25/13 with Dr Arbie Cookey braddy @ [place].  Pt wanted to know if this was ok to wait that long or should she get sooner appointment

## 2013-05-07 NOTE — Telephone Encounter (Signed)
Ultrasound on your desk.

## 2013-05-08 NOTE — Telephone Encounter (Signed)
Called pt.  Questions answered.  Ok to wait until 06/25/13.

## 2013-05-31 ENCOUNTER — Encounter: Payer: Self-pay | Admitting: Internal Medicine

## 2014-01-31 ENCOUNTER — Encounter: Payer: Self-pay | Admitting: Internal Medicine

## 2014-03-09 ENCOUNTER — Encounter: Payer: Self-pay | Admitting: Internal Medicine

## 2014-04-26 ENCOUNTER — Telehealth: Payer: Self-pay | Admitting: Internal Medicine

## 2014-04-26 DIAGNOSIS — R748 Abnormal levels of other serum enzymes: Secondary | ICD-10-CM

## 2014-04-26 DIAGNOSIS — Z13220 Encounter for screening for lipoid disorders: Secondary | ICD-10-CM

## 2014-04-26 NOTE — Telephone Encounter (Signed)
Please notify pt that I received notice from Dr Enis Gash that she wanted labs drawn.  I have placed the order for those labs and for a cholesterol check.  Please schedule pt a fasting lab appt.  Thanks.

## 2014-04-26 NOTE — Telephone Encounter (Signed)
Orders placed for labs per Dr Enis Gash

## 2014-05-02 ENCOUNTER — Ambulatory Visit: Payer: Managed Care, Other (non HMO) | Admitting: Internal Medicine

## 2014-05-12 ENCOUNTER — Other Ambulatory Visit: Payer: Managed Care, Other (non HMO)

## 2014-05-13 ENCOUNTER — Other Ambulatory Visit: Payer: Managed Care, Other (non HMO)

## 2014-05-18 ENCOUNTER — Encounter (INDEPENDENT_AMBULATORY_CARE_PROVIDER_SITE_OTHER): Payer: Self-pay

## 2014-05-18 ENCOUNTER — Encounter: Payer: Self-pay | Admitting: *Deleted

## 2014-05-18 ENCOUNTER — Other Ambulatory Visit (INDEPENDENT_AMBULATORY_CARE_PROVIDER_SITE_OTHER): Payer: Managed Care, Other (non HMO)

## 2014-05-18 DIAGNOSIS — R748 Abnormal levels of other serum enzymes: Secondary | ICD-10-CM

## 2014-05-18 DIAGNOSIS — Z13220 Encounter for screening for lipoid disorders: Secondary | ICD-10-CM

## 2014-05-18 LAB — CBC WITH DIFFERENTIAL/PLATELET
BASOS PCT: 0.7 % (ref 0.0–3.0)
Basophils Absolute: 0 10*3/uL (ref 0.0–0.1)
EOS ABS: 0.2 10*3/uL (ref 0.0–0.7)
EOS PCT: 3.6 % (ref 0.0–5.0)
HCT: 41.8 % (ref 36.0–46.0)
HEMOGLOBIN: 14.1 g/dL (ref 12.0–15.0)
LYMPHS PCT: 34.2 % (ref 12.0–46.0)
Lymphs Abs: 1.9 10*3/uL (ref 0.7–4.0)
MCHC: 33.7 g/dL (ref 30.0–36.0)
MCV: 88 fl (ref 78.0–100.0)
MONO ABS: 0.6 10*3/uL (ref 0.1–1.0)
Monocytes Relative: 10.9 % (ref 3.0–12.0)
NEUTROS ABS: 2.8 10*3/uL (ref 1.4–7.7)
NEUTROS PCT: 50.6 % (ref 43.0–77.0)
Platelets: 250 10*3/uL (ref 150.0–400.0)
RBC: 4.76 Mil/uL (ref 3.87–5.11)
RDW: 13.6 % (ref 11.5–15.5)
WBC: 5.6 10*3/uL (ref 4.0–10.5)

## 2014-05-18 LAB — LIPID PANEL
CHOLESTEROL: 167 mg/dL (ref 0–200)
HDL: 60.8 mg/dL (ref >39.00)
LDL Cholesterol: 89 mg/dL (ref 0–99)
NonHDL: 106.2
Total CHOL/HDL Ratio: 3
Triglycerides: 87 mg/dL (ref 0.0–149.0)
VLDL: 17.4 mg/dL (ref 0.0–40.0)

## 2014-05-18 LAB — COMPREHENSIVE METABOLIC PANEL
ALT: 38 U/L — AB (ref 0–35)
AST: 30 U/L (ref 0–37)
Albumin: 3.9 g/dL (ref 3.5–5.2)
Alkaline Phosphatase: 151 U/L — ABNORMAL HIGH (ref 39–117)
BILIRUBIN TOTAL: 1 mg/dL (ref 0.2–1.2)
BUN: 15 mg/dL (ref 6–23)
CALCIUM: 9.5 mg/dL (ref 8.4–10.5)
CHLORIDE: 104 meq/L (ref 96–112)
CO2: 28 meq/L (ref 19–32)
Creatinine, Ser: 0.74 mg/dL (ref 0.40–1.20)
GFR: 105.53 mL/min (ref >60.00)
GLUCOSE: 86 mg/dL (ref 70–99)
Potassium: 4.1 mEq/L (ref 3.5–5.1)
SODIUM: 139 meq/L (ref 135–145)
TOTAL PROTEIN: 7.3 g/dL (ref 6.0–8.3)

## 2014-05-18 LAB — PROTIME-INR
INR: 1 ratio (ref 0.8–1.0)
PROTHROMBIN TIME: 10.8 s (ref 9.6–13.1)

## 2014-07-22 ENCOUNTER — Ambulatory Visit (INDEPENDENT_AMBULATORY_CARE_PROVIDER_SITE_OTHER): Payer: Managed Care, Other (non HMO) | Admitting: Internal Medicine

## 2014-07-22 ENCOUNTER — Encounter: Payer: Self-pay | Admitting: Internal Medicine

## 2014-07-22 VITALS — BP 136/78 | HR 73 | Resp 14 | Ht 62.25 in | Wt 164.0 lb

## 2014-07-22 DIAGNOSIS — R7989 Other specified abnormal findings of blood chemistry: Secondary | ICD-10-CM

## 2014-07-22 DIAGNOSIS — C539 Malignant neoplasm of cervix uteri, unspecified: Secondary | ICD-10-CM

## 2014-07-22 DIAGNOSIS — R945 Abnormal results of liver function studies: Secondary | ICD-10-CM

## 2014-07-22 DIAGNOSIS — Z91048 Other nonmedicinal substance allergy status: Secondary | ICD-10-CM | POA: Diagnosis not present

## 2014-07-22 DIAGNOSIS — R748 Abnormal levels of other serum enzymes: Secondary | ICD-10-CM | POA: Diagnosis not present

## 2014-07-22 DIAGNOSIS — R030 Elevated blood-pressure reading, without diagnosis of hypertension: Secondary | ICD-10-CM

## 2014-07-22 DIAGNOSIS — Z0000 Encounter for general adult medical examination without abnormal findings: Secondary | ICD-10-CM

## 2014-07-22 DIAGNOSIS — D869 Sarcoidosis, unspecified: Secondary | ICD-10-CM

## 2014-07-22 DIAGNOSIS — Z9109 Other allergy status, other than to drugs and biological substances: Secondary | ICD-10-CM

## 2014-07-22 DIAGNOSIS — IMO0001 Reserved for inherently not codable concepts without codable children: Secondary | ICD-10-CM

## 2014-07-22 NOTE — Progress Notes (Signed)
Pre visit review using our clinic review tool, if applicable. No additional management support is needed unless otherwise documented below in the visit note. 

## 2014-07-23 LAB — HEPATIC FUNCTION PANEL
ALT: 24 U/L (ref 0–35)
AST: 27 U/L (ref 0–37)
Albumin: 4.1 g/dL (ref 3.5–5.2)
Alkaline Phosphatase: 132 U/L — ABNORMAL HIGH (ref 39–117)
BILIRUBIN TOTAL: 0.9 mg/dL (ref 0.2–1.2)
Bilirubin, Direct: 0.2 mg/dL (ref 0.0–0.3)
Indirect Bilirubin: 0.7 mg/dL (ref 0.2–1.2)
TOTAL PROTEIN: 7 g/dL (ref 6.0–8.3)

## 2014-07-24 ENCOUNTER — Encounter: Payer: Self-pay | Admitting: Internal Medicine

## 2014-07-24 DIAGNOSIS — Z0000 Encounter for general adult medical examination without abnormal findings: Secondary | ICD-10-CM | POA: Insufficient documentation

## 2014-07-24 DIAGNOSIS — R030 Elevated blood-pressure reading, without diagnosis of hypertension: Secondary | ICD-10-CM | POA: Insufficient documentation

## 2014-07-24 NOTE — Assessment & Plan Note (Signed)
S/p biopsy as outlined.  Recheck liver panel today.  Being followed at [facility].

## 2014-07-24 NOTE — Assessment & Plan Note (Signed)
Gets her physicals through gyn.  Need results of colonoscopy.  States had one 5-6 years ago.  Need mammogram report.

## 2014-07-24 NOTE — Progress Notes (Signed)
Patient ID: Diane French, female   DOB: Feb 14, 1961, 54 y.o.   MRN: [id number]   Subjective:    Patient ID: Diane French, female    DOB: September 20, 1960, 54 y.o.   MRN: [id number]  HPI  Patient here for a scheduled follow up.  Recently evaluated for abnormal liver function tests and elevated alkaline phosphatase level.  Had biopsy.  Revealed single granuloma.  Most recent liver panel had improved.  Reports no nausea or vomiting.  No bowel change.  No abdominal pain or cramping.  Feels she is handling stress well.  Does report persistent gum irritation.  Works in a Environmental education officer.  Plans to f/u with the dentist.  We discussed ENT referral.     Past Medical History  Diagnosis Date  . Hx of dysmenorrhea   . Abnormal Pap smear     HSIL  . Endometriosis   . H/O hemorrhoids   . Sarcoidosis   . Cancer 2011    cervical cancer  . Environmental allergies     Current Outpatient Prescriptions on File Prior to Visit  Medication Sig Dispense Refill  . levocetirizine (XYZAL) 5 MG tablet Take 5 mg by mouth every evening.     No current facility-administered medications on file prior to visit.    Review of Systems  Constitutional: Negative for appetite change and unexpected weight change.  HENT: Negative for congestion and sinus pressure.   Respiratory: Negative for cough, chest tightness and shortness of breath.   Cardiovascular: Negative for chest pain, palpitations and leg swelling.  Gastrointestinal: Negative for nausea, vomiting and abdominal pain.  Genitourinary: Negative for dysuria and difficulty urinating.  Neurological: Negative for dizziness, light-headedness and headaches.  Psychiatric/Behavioral: Negative for dysphoric mood and agitation.       Objective:     Blood pressure recheck:  120/86  Physical Exam  Constitutional: She appears well-developed and well-nourished. No distress.  HENT:  Nose: Nose normal.  Mouth/Throat: Oropharynx is clear and moist.  Neck: Neck supple. No  thyromegaly present.  Cardiovascular: Normal rate and regular rhythm.   Pulmonary/Chest: Breath sounds normal. No respiratory distress. She has no wheezes.  Abdominal: Soft. Bowel sounds are normal. There is no tenderness.  Musculoskeletal: She exhibits no edema or tenderness.  Lymphadenopathy:    She has no cervical adenopathy.  Skin: No rash noted. No erythema.  Psychiatric: She has a normal mood and affect. Her behavior is normal.    BP 136/78 mmHg  Pulse 73  Resp 14  Ht 5' 2.25" (1.581 m)  Wt 164 lb (74.39 kg)  BMI 29.76 kg/m2  SpO2 98%  PF 164 L/min Wt Readings from Last 3 Encounters:  07/22/14 164 lb (74.39 kg)  02/26/13 158 lb 12 oz (72.009 kg)  08/10/12 165 lb (74.844 kg)     Lab Results  Component Value Date   WBC 5.6 05/18/2014   HGB 14.1 05/18/2014   HCT 41.8 05/18/2014   PLT 250.0 05/18/2014   GLUCOSE 86 05/18/2014   CHOL 167 05/18/2014   TRIG 87.0 05/18/2014   HDL 60.80 05/18/2014   LDLCALC 89 05/18/2014   ALT 24 07/22/2014   AST 27 07/22/2014   NA 139 05/18/2014   K 4.1 05/18/2014   CL 104 05/18/2014   CREATININE 0.74 05/18/2014   BUN 15 05/18/2014   CO2 28 05/18/2014   TSH 2.80 08/25/2012   INR 1.0 05/18/2014       Assessment & Plan:   Problem List Items Addressed This Visit  Cervical cancer    S/p hysterectomy.  Followed by gyn.      Elevated alkaline phosphatase level    S/p biopsy as outlined.  Recheck liver panel today.  Being followed at [facility].        Elevated blood pressure    Elevated blood pressure.  Have her spot check her pressure.  Send in readings.  Follow.        Environmental allergies    Sees Dr Donneta Romberg.  Doing well on xyzal.       Health care maintenance    Gets her physicals through gyn.  Need results of colonoscopy.  States had one 5-6 years ago.  Need mammogram report.        Sarcoidosis    Followed by Dr Raul Del.  Had liver biopsy - single granuloma.  Currently stable.  Recheck liver panel today.          Other Visit Diagnoses    Abnormal liver function tests    -  Primary    Relevant Orders    Hepatic function panel (Completed)      I spent 25 minutes with the patient and more than 50% of the time was spent in consultation regarding the above.     Einar Pheasant, MD

## 2014-07-24 NOTE — Assessment & Plan Note (Signed)
Followed by Dr Raul Del.  Had liver biopsy - single granuloma.  Currently stable.  Recheck liver panel today.

## 2014-07-24 NOTE — Assessment & Plan Note (Signed)
S/p hysterectomy.  Followed by gyn.   

## 2014-07-24 NOTE — Assessment & Plan Note (Signed)
Sees Dr Donneta Romberg.  Doing well on xyzal.

## 2014-07-24 NOTE — Assessment & Plan Note (Signed)
Elevated blood pressure.  Have her spot check her pressure.  Send in readings.  Follow.

## 2014-07-25 ENCOUNTER — Encounter: Payer: Self-pay | Admitting: *Deleted

## 2014-10-27 ENCOUNTER — Ambulatory Visit (INDEPENDENT_AMBULATORY_CARE_PROVIDER_SITE_OTHER): Payer: Managed Care, Other (non HMO) | Admitting: Internal Medicine

## 2014-10-27 ENCOUNTER — Encounter: Payer: Self-pay | Admitting: Internal Medicine

## 2014-10-27 VITALS — BP 110/80 | HR 87 | Temp 98.3°F | Ht 62.25 in | Wt 167.5 lb

## 2014-10-27 DIAGNOSIS — C539 Malignant neoplasm of cervix uteri, unspecified: Secondary | ICD-10-CM

## 2014-10-27 DIAGNOSIS — R030 Elevated blood-pressure reading, without diagnosis of hypertension: Secondary | ICD-10-CM

## 2014-10-27 DIAGNOSIS — Z91048 Other nonmedicinal substance allergy status: Secondary | ICD-10-CM | POA: Diagnosis not present

## 2014-10-27 DIAGNOSIS — R21 Rash and other nonspecific skin eruption: Secondary | ICD-10-CM

## 2014-10-27 DIAGNOSIS — IMO0001 Reserved for inherently not codable concepts without codable children: Secondary | ICD-10-CM

## 2014-10-27 DIAGNOSIS — Z9109 Other allergy status, other than to drugs and biological substances: Secondary | ICD-10-CM

## 2014-10-27 DIAGNOSIS — R748 Abnormal levels of other serum enzymes: Secondary | ICD-10-CM

## 2014-10-27 DIAGNOSIS — Z0000 Encounter for general adult medical examination without abnormal findings: Secondary | ICD-10-CM

## 2014-10-27 LAB — HEPATIC FUNCTION PANEL
ALBUMIN: 4.2 g/dL (ref 3.5–5.2)
ALT: 18 U/L (ref 0–35)
AST: 22 U/L (ref 0–37)
Alkaline Phosphatase: 131 U/L — ABNORMAL HIGH (ref 39–117)
Bilirubin, Direct: 0.2 mg/dL (ref 0.0–0.3)
Total Bilirubin: 1.1 mg/dL (ref 0.2–1.2)
Total Protein: 7 g/dL (ref 6.0–8.3)

## 2014-10-27 NOTE — Progress Notes (Signed)
Patient ID: Diane French, female   DOB: Nov 26, 1960, 54 y.o.   MRN: [id number]   Subjective:    Patient ID: Diane French, female    DOB: 1960-04-12, 54 y.o.   MRN: [id number]  HPI  Patient here for a scheduled follow up.  Had cyst removed from right thumb.  Had sutures removed.  Healing.  Stays active.  No cardiac symptoms with increased activity or exertion.  No sob.  Eating and drinking well.  Bowels stable.  No abdominal pain or cramping.  Is up to date with her gyn exams.  Is also scheduled to f/u with liver specialist in the fall.  On xyzal.  Has a rash.  Is getting better.  Noticed on arms.  Here to f/u on her blood pressure.    Past Medical History  Diagnosis Date  . Hx of dysmenorrhea   . Abnormal Pap smear     HSIL  . Endometriosis   . H/O hemorrhoids   . Sarcoidosis   . Cancer 2011    cervical cancer  . Environmental allergies     Outpatient Encounter Prescriptions as of 10/27/2014  Medication Sig  . levocetirizine (XYZAL) 5 MG tablet Take 5 mg by mouth every evening.   No facility-administered encounter medications on file as of 10/27/2014.    Review of Systems  Constitutional: Negative for appetite change and unexpected weight change.  HENT: Negative for congestion and sinus pressure.   Respiratory: Negative for cough, chest tightness and shortness of breath.   Cardiovascular: Negative for chest pain, palpitations and leg swelling.  Gastrointestinal: Negative for nausea, vomiting, abdominal pain and diarrhea.  Genitourinary: Negative for dysuria and difficulty urinating.  Musculoskeletal: Negative for back pain and joint swelling.  Skin: Positive for rash. Negative for color change.  Neurological: Negative for dizziness, light-headedness and headaches.  Psychiatric/Behavioral: Negative for dysphoric mood and agitation.       Objective:     Blood pressure recheck:  118/78  Physical Exam  Constitutional: She appears well-developed and well-nourished. No  distress.  HENT:  Nose: Nose normal.  Mouth/Throat: Oropharynx is clear and moist.  Neck: Neck supple. No thyromegaly present.  Cardiovascular: Normal rate and regular rhythm.   Pulmonary/Chest: Breath sounds normal. No respiratory distress. She has no wheezes.  Abdominal: Soft. Bowel sounds are normal. There is no tenderness.  Musculoskeletal: She exhibits no edema or tenderness.  Lymphadenopathy:    She has no cervical adenopathy.  Skin: Rash noted. No erythema.  Psychiatric: She has a normal mood and affect. Her behavior is normal.    BP 110/80 mmHg  Pulse 87  Temp(Src) 98.3 F (36.8 C) (Oral)  Ht 5' 2.25" (1.581 m)  Wt 167 lb 8 oz (75.978 kg)  BMI 30.40 kg/m2  SpO2 95% Wt Readings from Last 3 Encounters:  10/27/14 167 lb 8 oz (75.978 kg)  07/22/14 164 lb (74.39 kg)  02/26/13 158 lb 12 oz (72.009 kg)     Lab Results  Component Value Date   WBC 5.6 05/18/2014   HGB 14.1 05/18/2014   HCT 41.8 05/18/2014   PLT 250.0 05/18/2014   GLUCOSE 86 05/18/2014   CHOL 167 05/18/2014   TRIG 87.0 05/18/2014   HDL 60.80 05/18/2014   LDLCALC 89 05/18/2014   ALT 18 10/27/2014   AST 22 10/27/2014   NA 139 05/18/2014   K 4.1 05/18/2014   CL 104 05/18/2014   CREATININE 0.74 05/18/2014   BUN 15 05/18/2014   CO2 28 05/18/2014  TSH 2.80 08/25/2012   INR 1.0 05/18/2014       Assessment & Plan:   Problem List Items Addressed This Visit    Cervical cancer    S/p TVH.  Followed by gyn.       Elevated alkaline phosphatase level - Primary    S/p biopsy.  Being followed at [facility].  Recheck liver panel today.       Relevant Orders   Hepatic function panel (Completed)   Elevated blood pressure    Previous elevated blood pressure.  Blood pressure today under good control.        Environmental allergies    Sees Dr Donneta Romberg.  Doing well on xyzal.       Health care maintenance    Gets her physicals through gyn.  Has mammogram at [facility] recently.  Obtain results.  States ok.   Colonoscopy - 5-6 years ago.        Rash    Improving.  Follow.  Notify me if persistent.          I spent 25 minutes with the patient and more than 50% of the time was spent in consultation regarding the above.     Einar Pheasant, MD

## 2014-10-27 NOTE — Progress Notes (Signed)
Pre visit review using our clinic review tool, if applicable. No additional management support is needed unless otherwise documented below in the visit note. 

## 2014-10-28 ENCOUNTER — Encounter: Payer: Self-pay | Admitting: *Deleted

## 2014-10-29 ENCOUNTER — Encounter: Payer: Self-pay | Admitting: Internal Medicine

## 2014-10-29 DIAGNOSIS — R21 Rash and other nonspecific skin eruption: Secondary | ICD-10-CM | POA: Insufficient documentation

## 2014-10-29 NOTE — Assessment & Plan Note (Signed)
Improving.  Follow.  Notify me if persistent.

## 2014-10-29 NOTE — Assessment & Plan Note (Signed)
Gets her physicals through gyn.  Has mammogram at [facility] recently.  Obtain results.  States ok.  Colonoscopy - 5-6 years ago.

## 2014-10-29 NOTE — Assessment & Plan Note (Signed)
S/p biopsy.  Being followed at [facility].  Recheck liver panel today.

## 2014-10-29 NOTE — Assessment & Plan Note (Signed)
Previous elevated blood pressure.  Blood pressure today under good control.

## 2014-10-29 NOTE — Assessment & Plan Note (Signed)
S/p TVH.  Followed by gyn.

## 2014-10-29 NOTE — Assessment & Plan Note (Signed)
Sees Dr Donneta Romberg.  Doing well on xyzal.

## 2015-04-05 ENCOUNTER — Other Ambulatory Visit: Payer: Self-pay | Admitting: *Deleted

## 2015-04-05 ENCOUNTER — Other Ambulatory Visit (INDEPENDENT_AMBULATORY_CARE_PROVIDER_SITE_OTHER): Payer: PRIVATE HEALTH INSURANCE

## 2015-04-05 ENCOUNTER — Encounter: Payer: Self-pay | Admitting: Internal Medicine

## 2015-04-05 DIAGNOSIS — R7989 Other specified abnormal findings of blood chemistry: Secondary | ICD-10-CM

## 2015-04-05 DIAGNOSIS — R945 Abnormal results of liver function studies: Principal | ICD-10-CM

## 2015-04-06 LAB — CBC WITH DIFFERENTIAL/PLATELET
BASOS ABS: 0 10*3/uL (ref 0.0–0.1)
Basophils Relative: 0.6 % (ref 0.0–3.0)
EOS ABS: 0.2 10*3/uL (ref 0.0–0.7)
Eosinophils Relative: 2.5 % (ref 0.0–5.0)
HCT: 42.8 % (ref 36.0–46.0)
Hemoglobin: 13.9 g/dL (ref 12.0–15.0)
LYMPHS ABS: 1.6 10*3/uL (ref 0.7–4.0)
Lymphocytes Relative: 24.4 % (ref 12.0–46.0)
MCHC: 32.5 g/dL (ref 30.0–36.0)
MCV: 90 fl (ref 78.0–100.0)
MONO ABS: 0.4 10*3/uL (ref 0.1–1.0)
Monocytes Relative: 6.6 % (ref 3.0–12.0)
NEUTROS ABS: 4.4 10*3/uL (ref 1.4–7.7)
NEUTROS PCT: 65.9 % (ref 43.0–77.0)
PLATELETS: 216 10*3/uL (ref 150.0–400.0)
RBC: 4.75 Mil/uL (ref 3.87–5.11)
RDW: 14.2 % (ref 11.5–15.5)
WBC: 6.7 10*3/uL (ref 4.0–10.5)

## 2015-04-06 LAB — COMPREHENSIVE METABOLIC PANEL
ALT: 50 U/L — ABNORMAL HIGH (ref 0–35)
AST: 32 U/L (ref 0–37)
Albumin: 4.3 g/dL (ref 3.5–5.2)
Alkaline Phosphatase: 144 U/L — ABNORMAL HIGH (ref 39–117)
BUN: 18 mg/dL (ref 6–23)
CHLORIDE: 104 meq/L (ref 96–112)
CO2: 27 mEq/L (ref 19–32)
Calcium: 9.9 mg/dL (ref 8.4–10.5)
Creatinine, Ser: 0.68 mg/dL (ref 0.40–1.20)
GFR: 115.96 mL/min (ref >60.00)
GLUCOSE: 89 mg/dL (ref 70–99)
POTASSIUM: 4.2 meq/L (ref 3.5–5.1)
Sodium: 139 mEq/L (ref 135–145)
TOTAL PROTEIN: 7 g/dL (ref 6.0–8.3)
Total Bilirubin: 1 mg/dL (ref 0.2–1.2)

## 2015-04-06 LAB — PROTIME-INR
INR: 1 ratio (ref 0.8–1.0)
Prothrombin Time: 10.8 s (ref 9.6–13.1)

## 2015-04-11 ENCOUNTER — Telehealth: Payer: Self-pay | Admitting: Internal Medicine

## 2015-04-11 NOTE — Telephone Encounter (Signed)
Called wanting recent labs and they were routed to them.

## 2015-05-01 ENCOUNTER — Ambulatory Visit: Payer: Managed Care, Other (non HMO) | Admitting: Internal Medicine

## 2015-05-03 LAB — HM MAMMOGRAPHY
HM MAMMO: NEGATIVE
HM Mammogram: NEGATIVE

## 2015-05-04 ENCOUNTER — Encounter: Payer: Self-pay | Admitting: Internal Medicine

## 2015-05-05 ENCOUNTER — Encounter: Payer: Self-pay | Admitting: Internal Medicine

## 2015-06-19 ENCOUNTER — Telehealth: Payer: Self-pay | Admitting: Internal Medicine

## 2015-06-19 ENCOUNTER — Encounter: Payer: Self-pay | Admitting: Family Medicine

## 2015-06-19 ENCOUNTER — Ambulatory Visit (INDEPENDENT_AMBULATORY_CARE_PROVIDER_SITE_OTHER): Payer: PRIVATE HEALTH INSURANCE | Admitting: Family Medicine

## 2015-06-19 VITALS — BP 118/76 | HR 98 | Temp 99.0°F | Ht 62.25 in | Wt 171.4 lb

## 2015-06-19 DIAGNOSIS — R509 Fever, unspecified: Secondary | ICD-10-CM

## 2015-06-19 DIAGNOSIS — J069 Acute upper respiratory infection, unspecified: Secondary | ICD-10-CM | POA: Insufficient documentation

## 2015-06-19 LAB — POCT INFLUENZA A/B
Influenza A, POC: NEGATIVE
Influenza B, POC: NEGATIVE

## 2015-06-19 MED ORDER — AMOXICILLIN-POT CLAVULANATE 875-125 MG PO TABS: 1.0000 | ORAL_TABLET | Freq: Two times a day (BID) | ORAL | Status: DC

## 2015-06-19 MED ORDER — HYDROCOD POLST-CPM POLST ER 10-8 MG/5ML PO SUER: 5.0000 mL | Freq: Two times a day (BID) | ORAL | Status: DC | PRN

## 2015-06-19 MED ORDER — BENZONATATE 200 MG PO CAPS: 200.0000 mg | ORAL_CAPSULE | Freq: Two times a day (BID) | ORAL | Status: DC | PRN

## 2015-06-19 NOTE — Progress Notes (Signed)
Patient ID: Diane French, female   DOB: Aug 15, 1960, 55 y.o.   MRN: [id number]  Tommi Rumps, MD Phone: [phone]  Diane French is a 55 y.o. female who presents today for same-day visit.  Patient notes onset of symptoms Saturday evening. Started with cough and postnasal drip. Mild sharp discomfort in her chest with the cough. No other chest pain with this. No shortness of breath. This was followed by significant sinus congestion and pressure. Notes yellowish mucus with cough. No mucus out of her nose. Temperature 101F yesterday. Highest temperature today was 100F. She notes her ears been popping. She notes her symptoms have not gotten worse since yesterday. She's been drinking well. Some sick contacts at work. Has taken DayQuil and Motrin with little benefit. She does take xyzal.  PMH: nonsmoker .   ROS see history of present illness  Objective  Physical Exam Filed Vitals:   06/19/15 1122  BP: 118/76  Pulse: 98  Temp: 99 F (37.2 C)    BP Readings from Last 3 Encounters:  06/19/15 118/76  10/27/14 110/80  07/22/14 136/78   Wt Readings from Last 3 Encounters:  06/19/15 171 lb 6.4 oz (77.747 kg)  10/27/14 167 lb 8 oz (75.978 kg)  07/22/14 164 lb (74.39 kg)    Physical Exam  Constitutional: She is well-developed, well-nourished, and in no distress.  HENT:  Head: Normocephalic and atraumatic.  Right Ear: External ear normal.  Left Ear: External ear normal.  Mouth/Throat: No oropharyngeal exudate.  Mild postnasal drip, TMs mildly erythematous with no purulent fluid  Eyes: Conjunctivae are normal. Pupils are equal, round, and reactive to light.  Neck: Neck supple.  Cardiovascular: Normal rate, regular rhythm and normal heart sounds.  Exam reveals no gallop and no friction rub.   No murmur heard. Pulmonary/Chest: Effort normal and breath sounds normal. No respiratory distress. She has no wheezes. She has no rales.  Lymphadenopathy:    She has no cervical adenopathy.   Neurological: She is alert. Gait normal.  Skin: Skin is warm and dry. She is not diaphoretic.     Assessment/Plan: Please see individual problem list.  Acute upper respiratory infection Suspect viral upper respiratory infection given symptoms induration. Negative rapid flu. Less likely bacterial sinus infection at this time given duration. We will treat conservatively with continued antihistamine and adding Flonase. Tussionex for cough. Stay well hydrated. I provided the patient with prescription for Augmentin to fill if she does not start to feel better in the next 2 days. If her sinus congestion worsens she may also fill this. If she develops any other new symptoms she needs to follow-up in the office. She will continue to monitor. She's given return precautions.    Orders Placed This Encounter  Procedures  . POCT Influenza A/B    Tommi Rumps, MD [place]

## 2015-06-19 NOTE — Telephone Encounter (Signed)
Pt called about having a fever and sinus pressure head and face hurting with a chest cold started Saturday morning. Fever now 100. No nausea no vomiting. Pt only wants to see Dr Nicki Reaper. I offered pt to see another provider. Call pt @ [phone]. Thank you!

## 2015-06-19 NOTE — Telephone Encounter (Signed)
Pt states that she is going to call her pulmanologist to see if she can get a appt

## 2015-06-19 NOTE — Progress Notes (Signed)
Pre visit review using our clinic review tool, if applicable. No additional management support is needed unless otherwise documented below in the visit note. 

## 2015-06-19 NOTE — Patient Instructions (Addendum)
Nice to meet you. Your symptoms are likely related to a viral illness and less likely related to a bacterial illness. We will review start on Flonase in addition to your xyzal to help treat her symptoms. You can take tussionex for cough. I have given her prescription for Augmentin to fill if your symptoms are not improving in the next several days. If you develop worsening sinus congestion may also fill this. If you develop any other symptoms or new symptoms he should let us know prior to filling the antibiotic. If you develop chest pain, shortness of breath, cough productive of blood, persistent fevers, or any new or changing symptoms please seek medical attention.

## 2015-06-19 NOTE — Assessment & Plan Note (Signed)
Suspect viral upper respiratory infection given symptoms induration. Negative rapid flu. Less likely bacterial sinus infection at this time given duration. We will treat conservatively with continued antihistamine and adding Flonase. Tussionex for cough. Stay well hydrated. I provided the patient with prescription for Augmentin to fill if she does not start to feel better in the next 2 days. If her sinus congestion worsens she may also fill this. If she develops any other new symptoms she needs to follow-up in the office. She will continue to monitor. She's given return precautions.

## 2015-07-11 ENCOUNTER — Ambulatory Visit (INDEPENDENT_AMBULATORY_CARE_PROVIDER_SITE_OTHER): Payer: PRIVATE HEALTH INSURANCE | Admitting: Internal Medicine

## 2015-07-11 ENCOUNTER — Encounter: Payer: Self-pay | Admitting: Internal Medicine

## 2015-07-11 VITALS — BP 120/80 | HR 80 | Temp 97.8°F | Resp 18 | Ht 62.25 in | Wt 171.2 lb

## 2015-07-11 DIAGNOSIS — D869 Sarcoidosis, unspecified: Secondary | ICD-10-CM

## 2015-07-11 DIAGNOSIS — Z91048 Other nonmedicinal substance allergy status: Secondary | ICD-10-CM | POA: Diagnosis not present

## 2015-07-11 DIAGNOSIS — Z9109 Other allergy status, other than to drugs and biological substances: Secondary | ICD-10-CM

## 2015-07-11 DIAGNOSIS — R748 Abnormal levels of other serum enzymes: Secondary | ICD-10-CM | POA: Diagnosis not present

## 2015-07-11 NOTE — Progress Notes (Signed)
Pre-visit discussion using our clinic review tool. No additional management support is needed unless otherwise documented below in the visit note.  

## 2015-07-11 NOTE — Progress Notes (Signed)
Patient ID: Diane French, female   DOB: [birth year], 55 y.o.   MRN: [id number]   Subjective:    Patient ID: Diane French, female    DOB: 02/16/1961, 55 y.o.   MRN: [id number]  HPI  Patient here for a scheduled follow up.  She reports she is doing well.  Feels good.  Takes xyzal for her allergies.  This seems to control.  Daughter is getting ready to go to college.  Finishing up her senior year.  Son is in college in [place] and doing well.  Tries to stay active.  No cardiac symptoms with increased activity or exertion.  No sob.  No acid reflux.  No abdominal pain or cramping.  Bowels stable.  Overall doing well.     Past Medical History  Diagnosis Date  . Hx of dysmenorrhea   . Abnormal Pap smear     HSIL  . Endometriosis   . H/O hemorrhoids   . Sarcoidosis ([place])   . Cancer [facility]) 2011    cervical cancer  . Environmental allergies    Past Surgical History  Procedure Laterality Date  . Laparoscopy    . Dilation and curettage of uterus  1992    Miscarriage   . Abdominal hysterectomy  2011    partial, ovaries left in place  . Cystectomy      parotid   Family History  Problem Relation Age of Onset  . Hypertension Mother   . Diabetes Mother   . Cancer Father     lung cancer  . Cancer Maternal Grandfather 92    prostate cancer  . Breast cancer      niece (dx age 10)   Social History   Social History  . Marital Status: Married    Spouse Name: N/A  . Number of Children: 2  . Years of Education: N/A   Occupational History  . Dental Assistant    Social History Main Topics  . Smoking status: Never Smoker   . Smokeless tobacco: Never Used  . Alcohol Use: No  . Drug Use: No  . Sexual Activity: Yes    Birth Control/ Protection: Surgical     Comment: hyst   Other Topics Concern  . None   Social History Narrative    Outpatient Encounter Prescriptions as of 07/11/2015  Medication Sig  . levocetirizine (XYZAL) 5 MG tablet Take 5 mg by mouth every evening.  . Vitamin  D, Ergocalciferol, (DRISDOL) 50000 units CAPS capsule TAKE 1 CAPSULE(S) EVERY WEEK BY ORAL ROUTE.  . [DISCONTINUED] amoxicillin-clavulanate (AUGMENTIN) 875-125 MG tablet Take 1 tablet by mouth 2 (two) times daily. Do not fill until 06/21/15  . [DISCONTINUED] chlorpheniramine-HYDROcodone (TUSSIONEX PENNKINETIC ER) 10-8 MG/5ML SUER Take 5 mLs by mouth every 12 (twelve) hours as needed for cough.   No facility-administered encounter medications on file as of 07/11/2015.    Review of Systems  Constitutional: Negative for appetite change and unexpected weight change.  HENT: Negative for congestion and sinus pressure.   Respiratory: Negative for cough, chest tightness and shortness of breath.   Cardiovascular: Negative for chest pain, palpitations and leg swelling.  Gastrointestinal: Negative for nausea, vomiting, abdominal pain and diarrhea.  Genitourinary: Negative for dysuria and difficulty urinating.  Musculoskeletal: Negative for back pain and joint swelling.  Skin: Negative for color change and rash.  Neurological: Negative for dizziness, light-headedness and headaches.  Psychiatric/Behavioral: Negative for dysphoric mood and agitation.       Objective:    Physical Exam  Constitutional: She appears well-developed and well-nourished. No distress.  HENT:  Nose: Nose normal.  Mouth/Throat: Oropharynx is clear and moist.  Neck: Neck supple. No thyromegaly present.  Cardiovascular: Normal rate and regular rhythm.   Pulmonary/Chest: Breath sounds normal. No respiratory distress. She has no wheezes.  Abdominal: Soft. Bowel sounds are normal. There is no tenderness.  Musculoskeletal: She exhibits no edema or tenderness.  Lymphadenopathy:    She has no cervical adenopathy.  Skin: No rash noted. No erythema.  Psychiatric: She has a normal mood and affect. Her behavior is normal.    BP 120/80 mmHg  Pulse 80  Temp(Src) 97.8 F (36.6 C) (Oral)  Resp 18  Ht 5' 2.25" (1.581 m)  Wt 171 lb  4 oz (77.678 kg)  BMI 31.08 kg/m2  SpO2 95% Wt Readings from Last 3 Encounters:  07/11/15 171 lb 4 oz (77.678 kg)  06/19/15 171 lb 6.4 oz (77.747 kg)  10/27/14 167 lb 8 oz (75.978 kg)     Lab Results  Component Value Date   WBC 6.7 04/05/2015   HGB 13.9 04/05/2015   HCT 42.8 04/05/2015   PLT 216.0 04/05/2015   GLUCOSE 89 04/05/2015   CHOL 167 05/18/2014   TRIG 87.0 05/18/2014   HDL 60.80 05/18/2014   LDLCALC 89 05/18/2014   ALT 50* 04/05/2015   AST 32 04/05/2015   NA 139 04/05/2015   K 4.2 04/05/2015   CL 104 04/05/2015   CREATININE 0.68 04/05/2015   BUN 18 04/05/2015   CO2 27 04/05/2015   TSH 2.80 08/25/2012   INR 1.0 04/05/2015        Assessment & Plan:   Problem List Items Addressed This Visit    Elevated alkaline phosphatase level    S/p biopsy.  Being followed at [facility].  Recheck liver panel with next labs.       Environmental allergies    Controlled on xyzal.  Follow.        Sarcoidosis ([place]) - Primary    Was followed by Dr Raul Del.  Currently stable.            Einar Pheasant, MD

## 2015-07-16 ENCOUNTER — Encounter: Payer: Self-pay | Admitting: Internal Medicine

## 2015-07-16 NOTE — Assessment & Plan Note (Signed)
S/p biopsy.  Being followed at [facility].  Recheck liver panel with next labs.

## 2015-07-16 NOTE — Assessment & Plan Note (Signed)
Was followed by Dr Raul Del.  Currently stable.

## 2015-07-16 NOTE — Assessment & Plan Note (Signed)
Controlled on xyzal.  Follow.

## 2015-10-02 ENCOUNTER — Telehealth: Payer: Self-pay | Admitting: *Deleted

## 2015-10-02 NOTE — Telephone Encounter (Signed)
Attempted to call patient back,   Left a message to return my call.

## 2015-10-02 NOTE — Telephone Encounter (Signed)
Patient requested a phone consult for her right ear. She stated the outer ear has turned red, and has some pain. No injury to report  Pt contact [phone]

## 2016-01-11 ENCOUNTER — Encounter: Payer: Self-pay | Admitting: Internal Medicine

## 2016-01-11 ENCOUNTER — Ambulatory Visit (INDEPENDENT_AMBULATORY_CARE_PROVIDER_SITE_OTHER): Payer: PRIVATE HEALTH INSURANCE | Admitting: Internal Medicine

## 2016-01-11 VITALS — BP 124/88 | HR 87 | Temp 98.4°F | Ht 62.0 in | Wt 160.4 lb

## 2016-01-11 DIAGNOSIS — Z0000 Encounter for general adult medical examination without abnormal findings: Secondary | ICD-10-CM

## 2016-01-11 DIAGNOSIS — R030 Elevated blood-pressure reading, without diagnosis of hypertension: Secondary | ICD-10-CM

## 2016-01-11 DIAGNOSIS — Z9109 Other allergy status, other than to drugs and biological substances: Secondary | ICD-10-CM

## 2016-01-11 DIAGNOSIS — L989 Disorder of the skin and subcutaneous tissue, unspecified: Secondary | ICD-10-CM | POA: Diagnosis not present

## 2016-01-11 DIAGNOSIS — C539 Malignant neoplasm of cervix uteri, unspecified: Secondary | ICD-10-CM

## 2016-01-11 DIAGNOSIS — R220 Localized swelling, mass and lump, head: Secondary | ICD-10-CM

## 2016-01-11 DIAGNOSIS — D869 Sarcoidosis, unspecified: Secondary | ICD-10-CM | POA: Diagnosis not present

## 2016-01-11 DIAGNOSIS — R748 Abnormal levels of other serum enzymes: Secondary | ICD-10-CM

## 2016-01-11 MED ORDER — EPINEPHRINE 0.3 MG/0.3ML IJ SOAJ: 0.3000 mg | Freq: Once | INTRAMUSCULAR | 1 refills | Status: AC

## 2016-01-11 NOTE — Progress Notes (Signed)
Pre visit review using our clinic review tool, if applicable. No additional management support is needed unless otherwise documented below in the visit note. 

## 2016-01-11 NOTE — Progress Notes (Signed)
Patient ID: Diane French, female   DOB: July 06, 1960, 55 y.o.   MRN: [id number]   Subjective:    Patient ID: Diane French, female    DOB: 10-Aug-1960, 55 y.o.   MRN: [id number]  HPI  Patient here for a scheduled follow up.  She reports she has been having issues with her lip swelling and allergy issues.  Was seeing Dr Donneta Romberg.  Wants to change to [facility] allergy.  She has been trying to find the trigger.  She was questioning if pepper could be triggering.  Has an epipen.  Expired.  Needs new rx.  Occurred 2-3x a month ago.  No chest pain.  No sob.  No acid reflux.  No abdominal pain or cramping.  Bowels stable.  Does report a persistent skin lesion on her right shoulder.  She has irritated the area.  Would like evaluated.     Past Medical History:  Diagnosis Date  . Abnormal Pap smear    HSIL  . Cancer [facility]) 2011   cervical cancer  . Endometriosis   . Environmental allergies   . H/O hemorrhoids   . Hx of dysmenorrhea   . Sarcoidosis [facility])    Past Surgical History:  Procedure Laterality Date  . ABDOMINAL HYSTERECTOMY  2011   partial, ovaries left in place  . CYSTECTOMY     parotid  . DILATION AND CURETTAGE OF UTERUS  1992   Miscarriage   . LAPAROSCOPY     Family History  Problem Relation Age of Onset  . Hypertension Mother   . Diabetes Mother   . Cancer Father     lung cancer  . Cancer Maternal Grandfather 92    prostate cancer  . Breast cancer      niece (dx age 107)   Social History   Social History  . Marital status: Married    Spouse name: N/A  . Number of children: 2  . 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   Social History Main Topics  . Smoking status: Never Smoker  . Smokeless tobacco: Never Used  . Alcohol use No  . Drug use: No  . Sexual activity: Yes    Birth control/ protection: Surgical     Comment: hyst   Other Topics Concern  . None   Social History Narrative  . None    Outpatient Encounter  Prescriptions as of 01/11/2016  Medication Sig  . levocetirizine (XYZAL) 5 MG tablet Take 5 mg by mouth every evening.  . [EXPIRED] EPINEPHrine (EPIPEN 2-PAK) 0.3 mg/0.3 mL IJ SOAJ injection Inject 0.3 mLs (0.3 mg total) into the muscle once.  . [DISCONTINUED] Vitamin D, Ergocalciferol, (DRISDOL) 50000 units CAPS capsule TAKE 1 CAPSULE(S) EVERY WEEK BY ORAL ROUTE.   No facility-administered encounter medications on file as of 01/11/2016.     Review of Systems  Constitutional: Negative for appetite change and unexpected weight change.  HENT: Negative for congestion and sinus pressure.   Respiratory: Negative for cough, chest tightness and shortness of breath.   Cardiovascular: Negative for chest pain, palpitations and leg swelling.  Gastrointestinal: Negative for abdominal pain, diarrhea, nausea and vomiting.  Genitourinary: Negative for difficulty urinating and dysuria.  Musculoskeletal: Negative for back pain and joint swelling.  Skin: Negative for color change and rash.  Neurological: Negative for dizziness, light-headedness and headaches.  Psychiatric/Behavioral: Negative for agitation and dysphoric mood.       Objective:    Physical Exam  Constitutional: She appears well-developed and well-nourished. No distress.  HENT:  Nose: Nose normal.  Mouth/Throat: Oropharynx is clear and moist.  Neck: Neck supple. No thyromegaly present.  Cardiovascular: Normal rate and regular rhythm.   Pulmonary/Chest: Breath sounds normal. No respiratory distress. She has no wheezes.  Abdominal: Soft. Bowel sounds are normal. There is no tenderness.  Musculoskeletal: She exhibits no edema or tenderness.  Lymphadenopathy:    She has no cervical adenopathy.  Skin: No rash noted. No erythema.  Psychiatric: She has a normal mood and affect. Her behavior is normal.    BP 124/88   Pulse 87   Temp 98.4 F (36.9 C) (Oral)   Ht 5\' 2"  (1.575 m)   Wt 160 lb 6.4 oz (72.8 kg)   SpO2 95%   BMI 29.34  kg/m  Wt Readings from Last 3 Encounters:  01/11/16 160 lb 6.4 oz (72.8 kg)  07/11/15 171 lb 4 oz (77.7 kg)  06/19/15 171 lb 6.4 oz (77.7 kg)     Lab Results  Component Value Date   WBC 6.7 04/05/2015   HGB 13.9 04/05/2015   HCT 42.8 04/05/2015   PLT 216.0 04/05/2015   GLUCOSE 89 04/05/2015   CHOL 167 05/18/2014   TRIG 87.0 05/18/2014   HDL 60.80 05/18/2014   LDLCALC 89 05/18/2014   ALT 50 (H) 04/05/2015   AST 32 04/05/2015   NA 139 04/05/2015   K 4.2 04/05/2015   CL 104 04/05/2015   CREATININE 0.68 04/05/2015   BUN 18 04/05/2015   CO2 27 04/05/2015   TSH 2.80 08/25/2012   INR 1.0 04/05/2015       Assessment & Plan:   Problem List Items Addressed This Visit    Cervical cancer ([place])    S/p TVH.  Followed by gyn.       Elevated alkaline phosphatase level    S/p biopsy.  Followed at [facility].  Felt stable.  Recommended f/u liver panel q 6 months.  They will see her yearly.        Environmental allergies    Takes xyzal.        Health care maintenance    Gets her physicals through gyn.  Mammogram 05/03/15 - Birads I.        Lip swelling    Intermittent lip swelling - on xyzal.  Unsure of trigger.  Will refer to [facility] allergy.  Continue xyzal.  Epi pen refilled.        Relevant Orders   Ambulatory referral to Allergy   Sarcoidosis [facility])    Was followed by Dr Raul Del.  Stable.       Transient elevated blood pressure    Blood pressure has been doing reasonably well.  Recheck improved.  Follow pressures.  Follow metabolic panel.        Other Visit Diagnoses    Skin lesion    -  Primary   right shoulder lesion.  refer to dermatology.    Relevant Orders   Ambulatory referral to Dermatology       Einar Pheasant, MD

## 2016-01-14 ENCOUNTER — Encounter: Payer: Self-pay | Admitting: Internal Medicine

## 2016-01-14 DIAGNOSIS — R220 Localized swelling, mass and lump, head: Secondary | ICD-10-CM | POA: Insufficient documentation

## 2016-01-14 NOTE — Assessment & Plan Note (Signed)
S/p biopsy.  Followed at [facility].  Felt stable.  Recommended f/u liver panel q 6 months.  They will see her yearly.

## 2016-01-14 NOTE — Assessment & Plan Note (Signed)
Takes xyzal.

## 2016-01-14 NOTE — Assessment & Plan Note (Signed)
Was followed by Dr Fleming.  Stable.   

## 2016-01-14 NOTE — Assessment & Plan Note (Signed)
Intermittent lip swelling - on xyzal.  Unsure of trigger.  Will refer to [facility].  Continue xyzal.  Epi pen refilled.

## 2016-01-14 NOTE — Assessment & Plan Note (Signed)
Gets her physicals through gyn.  Mammogram 05/03/15 - Birads I.

## 2016-01-14 NOTE — Assessment & Plan Note (Signed)
Blood pressure has been doing reasonably well.  Recheck improved.  Follow pressures.  Follow metabolic panel.

## 2016-01-14 NOTE — Assessment & Plan Note (Signed)
S/p TVH.  Followed by gyn.

## 2016-01-22 ENCOUNTER — Encounter: Payer: Self-pay | Admitting: Internal Medicine

## 2016-01-26 ENCOUNTER — Telehealth: Payer: Self-pay | Admitting: Internal Medicine

## 2016-01-26 DIAGNOSIS — L659 Nonscarring hair loss, unspecified: Secondary | ICD-10-CM

## 2016-01-26 NOTE — Telephone Encounter (Signed)
The patient is needing a referral to this specific dermatologist . She is having hair loss.The dermatologist is  Amy Teacher, adult education.[phone].

## 2016-01-26 NOTE — Telephone Encounter (Signed)
Please advise 

## 2016-01-27 NOTE — Telephone Encounter (Signed)
See her phone note.  Order placed for dermatology referral.

## 2016-01-30 ENCOUNTER — Encounter: Payer: Self-pay | Admitting: Internal Medicine

## 2016-05-13 ENCOUNTER — Other Ambulatory Visit (INDEPENDENT_AMBULATORY_CARE_PROVIDER_SITE_OTHER): Payer: PRIVATE HEALTH INSURANCE

## 2016-05-13 DIAGNOSIS — R748 Abnormal levels of other serum enzymes: Secondary | ICD-10-CM | POA: Diagnosis not present

## 2016-05-14 LAB — HEPATIC FUNCTION PANEL
ALBUMIN: 4.3 g/dL (ref 3.5–5.2)
ALT: 50 U/L — AB (ref 0–35)
AST: 42 U/L — AB (ref 0–37)
Alkaline Phosphatase: 117 U/L (ref 39–117)
Bilirubin, Direct: 0.2 mg/dL (ref 0.0–0.3)
TOTAL PROTEIN: 7.3 g/dL (ref 6.0–8.3)
Total Bilirubin: 0.8 mg/dL (ref 0.2–1.2)

## 2016-05-17 ENCOUNTER — Telehealth: Payer: Self-pay | Admitting: Internal Medicine

## 2016-05-17 NOTE — Telephone Encounter (Signed)
Has she been around her or possibly exposed to her?  If so, then I can go ahead and send in for prophylaxis of tamiflu.

## 2016-05-17 NOTE — Telephone Encounter (Signed)
Pt called and stated that her mom was diagnosed with the flu on Tuesday and was just released from the hospital. Pt would like to know when she can be around her presence due to Sarcoidosis. Please advise, thank you!  Call pt @ [phone]

## 2016-05-17 NOTE — Telephone Encounter (Signed)
She hasn't been around her mom .   She wants to go visit her.  She had the flu injection.

## 2016-05-18 NOTE — Telephone Encounter (Signed)
Spoke to pt and discussed her mother's illness and hospital course.  She has not been around her mother recently.  Will continue to hold on visiting until her mother is better.

## 2016-06-21 ENCOUNTER — Other Ambulatory Visit: Payer: Self-pay | Admitting: Internal Medicine

## 2016-06-21 ENCOUNTER — Telehealth: Payer: Self-pay | Admitting: Internal Medicine

## 2016-06-21 MED ORDER — OSELTAMIVIR PHOSPHATE 75 MG PO CAPS: 75.0000 mg | ORAL_CAPSULE | Freq: Every day | ORAL | 0 refills | Status: DC

## 2016-06-21 NOTE — Telephone Encounter (Signed)
Called patient she is not having symptoms. Given her lung issues she was not sure if you would want her to take a preventative? Let her know you are out of the office today but I will send message.

## 2016-06-21 NOTE — Progress Notes (Signed)
rx sent in for tamiflu.  See phone note.

## 2016-06-21 NOTE — Telephone Encounter (Signed)
Given close contact, I will send in rx for tamiflu.  Since no symptoms now, she is to take the prophylactic dose.  She will take one per day for 10 days.   If she develops any symptoms, then change to one po bid until she finishes the prescription.   Let us know if any problems.

## 2016-06-21 NOTE — Telephone Encounter (Signed)
Pt will call if any questions or problems will start meds today.

## 2016-06-21 NOTE — Telephone Encounter (Signed)
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 Pt would like to know what she needs to do with her condition that she has ? Please advise?  Call pt @ [phone]. Thank you!

## 2017-11-05 LAB — HM PAP SMEAR

## 2018-01-01 ENCOUNTER — Encounter: Payer: Self-pay | Admitting: Internal Medicine

## 2018-01-01 ENCOUNTER — Ambulatory Visit (INDEPENDENT_AMBULATORY_CARE_PROVIDER_SITE_OTHER): Payer: PRIVATE HEALTH INSURANCE | Admitting: Internal Medicine

## 2018-01-01 VITALS — BP 126/86 | HR 91 | Temp 98.1°F | Resp 18 | Wt 166.6 lb

## 2018-01-01 DIAGNOSIS — R748 Abnormal levels of other serum enzymes: Secondary | ICD-10-CM

## 2018-01-01 DIAGNOSIS — R030 Elevated blood-pressure reading, without diagnosis of hypertension: Secondary | ICD-10-CM

## 2018-01-01 DIAGNOSIS — Z23 Encounter for immunization: Secondary | ICD-10-CM | POA: Diagnosis not present

## 2018-01-01 DIAGNOSIS — D869 Sarcoidosis, unspecified: Secondary | ICD-10-CM | POA: Diagnosis not present

## 2018-01-01 LAB — HEPATIC FUNCTION PANEL
ALBUMIN: 4.4 g/dL (ref 3.5–5.2)
ALK PHOS: 96 U/L (ref 39–117)
ALT: 20 U/L (ref 0–35)
AST: 24 U/L (ref 0–37)
Bilirubin, Direct: 0.2 mg/dL (ref 0.0–0.3)
Total Bilirubin: 1 mg/dL (ref 0.2–1.2)
Total Protein: 7.3 g/dL (ref 6.0–8.3)

## 2018-01-01 LAB — CBC WITH DIFFERENTIAL/PLATELET
BASOS ABS: 0 10*3/uL (ref 0.0–0.1)
BASOS PCT: 0.9 % (ref 0.0–3.0)
EOS PCT: 2.6 % (ref 0.0–5.0)
Eosinophils Absolute: 0.1 10*3/uL (ref 0.0–0.7)
HCT: 45.1 % (ref 36.0–46.0)
Hemoglobin: 14.8 g/dL (ref 12.0–15.0)
LYMPHS ABS: 1.4 10*3/uL (ref 0.7–4.0)
LYMPHS PCT: 25.4 % (ref 12.0–46.0)
MCHC: 32.8 g/dL (ref 30.0–36.0)
MCV: 91.6 fl (ref 78.0–100.0)
MONOS PCT: 9.7 % (ref 3.0–12.0)
Monocytes Absolute: 0.5 10*3/uL (ref 0.1–1.0)
NEUTROS ABS: 3.3 10*3/uL (ref 1.4–7.7)
NEUTROS PCT: 61.4 % (ref 43.0–77.0)
PLATELETS: 216 10*3/uL (ref 150.0–400.0)
RBC: 4.92 Mil/uL (ref 3.87–5.11)
RDW: 13.6 % (ref 11.5–15.5)
WBC: 5.3 10*3/uL (ref 4.0–10.5)

## 2018-01-01 LAB — BASIC METABOLIC PANEL
BUN: 12 mg/dL (ref 6–23)
CALCIUM: 9.7 mg/dL (ref 8.4–10.5)
CO2: 32 meq/L (ref 19–32)
CREATININE: 0.68 mg/dL (ref 0.40–1.20)
Chloride: 103 mEq/L (ref 96–112)
GFR: 114.8 mL/min (ref >60.00)
Glucose, Bld: 87 mg/dL (ref 70–99)
Potassium: 4.2 mEq/L (ref 3.5–5.1)
SODIUM: 139 meq/L (ref 135–145)

## 2018-01-01 LAB — PROTIME-INR
INR: 1 ratio (ref 0.8–1.0)
Prothrombin Time: 11.4 s (ref 9.6–13.1)

## 2018-01-01 NOTE — Progress Notes (Signed)
Patient ID: Diane French, female   DOB: 01-10-1961, 57 y.o.   MRN: [id number]   Subjective:    Patient ID: Diane French, female    DOB: Mar 04, 1961, 57 y.o.   MRN: [id number]  HPI  Patient here for a scheduled follow up.  She reports she is doing relatively well.  Just evaluated by Dr Raul Del - f/u sarcoid.  Felt stable. Recommended f/u in one year.  Breathing stable.  No chest pain.  No sob.  No acid reflux.  No abdominal pain.  Bowels moving.  Does report eye twitching.  Persistent.  Daily. Not occurred today.   Not sleeping as well recently.  Discussed eye strain, stress and not sleeping - could be contributing.  Discussed melatonin to help her sleep.  She also had questions about f/u labs for her liver.  Has f/u with GI 01/2018.     Past Medical History:  Diagnosis Date  . Abnormal Pap smear    HSIL  . Cancer [facility]) 2011   cervical cancer  . Endometriosis   . Environmental allergies   . H/O hemorrhoids   . Hx of dysmenorrhea   . Sarcoidosis    Past Surgical History:  Procedure Laterality Date  . ABDOMINAL HYSTERECTOMY  2011   partial, ovaries left in place  . CYSTECTOMY     parotid  . DILATION AND CURETTAGE OF UTERUS  1992   Miscarriage   . LAPAROSCOPY     Family History  Problem Relation Age of Onset  . Hypertension Mother   . Diabetes Mother   . Cancer Father        lung cancer  . Cancer Maternal Grandfather 92       prostate cancer  . Breast cancer Unknown        niece (dx age 27)   Social History   Socioeconomic History  . Marital status: Married    Spouse name: Not on file  . Number of children: 2  . Years of education: Not on file  . Highest education level: Not on file  Occupational History  . Occupation: Insurance claims handler: [employer]  Social Needs  . Financial resource strain: Not on file  . Food insecurity:    Worry: Not on file    Inability: Not on file  . Transportation needs:    Medical: Not on file    Non-medical: Not on file   Tobacco Use  . Smoking status: Never Smoker  . Smokeless tobacco: Never Used  Substance and Sexual Activity  . Alcohol use: No    Alcohol/week: 0.0 standard drinks  . Drug use: No  . Sexual activity: Yes    Birth control/protection: Surgical    Comment: hyst  Lifestyle  . Physical activity:    Days per week: Not on file    Minutes per session: Not on file  . Stress: Not on file  Relationships  . Social connections:    Talks on phone: Not on file    Gets together: Not on file    Attends religious service: Not on file    Active member of club or organization: Not on file    Attends meetings of clubs or organizations: Not on file    Relationship status: Not on file  Other Topics Concern  . Not on file  Social History Narrative  . Not on file    Outpatient Encounter Medications as of 01/01/2018  Medication Sig  . levocetirizine (XYZAL)  5 MG tablet Take 5 mg by mouth every evening.  . [DISCONTINUED] oseltamivir (TAMIFLU) 75 MG capsule Take 1 capsule (75 mg total) by mouth daily.   No facility-administered encounter medications on file as of 01/01/2018.     Review of Systems  Constitutional: Negative for appetite change and unexpected weight change.  HENT: Negative for congestion and sinus pressure.   Respiratory: Negative for cough, chest tightness and shortness of breath.   Cardiovascular: Negative for chest pain, palpitations and leg swelling.  Gastrointestinal: Negative for abdominal pain, nausea and vomiting.  Genitourinary: Negative for difficulty urinating and dysuria.  Musculoskeletal: Negative for joint swelling and myalgias.  Skin: Negative for color change and rash.  Neurological: Negative for dizziness, light-headedness and headaches.  Psychiatric/Behavioral: Negative for agitation and dysphoric mood.       Objective:    Physical Exam  Constitutional: She appears well-developed and well-nourished. No distress.  HENT:  Nose: Nose normal.  Mouth/Throat:  Oropharynx is clear and moist.  Neck: Neck supple. No thyromegaly present.  Cardiovascular: Normal rate and regular rhythm.  Pulmonary/Chest: Breath sounds normal. No respiratory distress. She has no wheezes.  Abdominal: Soft. Bowel sounds are normal. There is no tenderness.  Musculoskeletal: She exhibits no edema or tenderness.  Lymphadenopathy:    She has no cervical adenopathy.  Skin: No rash noted. No erythema.  Psychiatric: She has a normal mood and affect. Her behavior is normal.    BP 126/86 (BP Location: Left Arm, Patient Position: Sitting, Cuff Size: Normal)   Pulse 91   Temp 98.1 F (36.7 C) (Oral)   Resp 18   Wt 166 lb 9.6 oz (75.6 kg)   SpO2 97%   BMI 30.47 kg/m  Wt Readings from Last 3 Encounters:  01/01/18 166 lb 9.6 oz (75.6 kg)  01/11/16 160 lb 6.4 oz (72.8 kg)  07/11/15 171 lb 4 oz (77.7 kg)     Lab Results  Component Value Date   WBC 5.3 01/01/2018   HGB 14.8 01/01/2018   HCT 45.1 01/01/2018   PLT 216.0 01/01/2018   GLUCOSE 87 01/01/2018   CHOL 167 05/18/2014   TRIG 87.0 05/18/2014   HDL 60.80 05/18/2014   LDLCALC 89 05/18/2014   ALT 20 01/01/2018   AST 24 01/01/2018   NA 139 01/01/2018   K 4.2 01/01/2018   CL 103 01/01/2018   CREATININE 0.68 01/01/2018   BUN 12 01/01/2018   CO2 32 01/01/2018   TSH 2.80 08/25/2012   INR 1.0 01/01/2018          Assessment & Plan:   Problem List Items Addressed This Visit    Elevated alkaline phosphatase level - Primary    S/p biopsy.  Followed at [facility].  Recheck liver panel and pt/inr.  Has f/u planned in 01/2018.        Relevant Orders   Hepatic function panel (Completed)   Protime-INR (Completed)   Sarcoidosis    Followed by Dr Raul Del.  Just evaluated 09/2017. Stable.  Recommended f/u in one year.        Relevant Orders   CBC with Differential/Platelet (Completed)   Basic metabolic panel (Completed)   Transient elevated blood pressure    Have her spot check her pressure.  Schedule f/u - to f/u  on her blood pressure.         Other Visit Diagnoses    Need for immunization against influenza       Relevant Orders   Flu Vaccine QUAD 36+  mos IM (Completed)       Einar Pheasant, MD

## 2018-01-04 ENCOUNTER — Encounter: Payer: Self-pay | Admitting: Internal Medicine

## 2018-01-04 NOTE — Assessment & Plan Note (Signed)
Followed by Dr Raul Del.  Just evaluated 09/2017. Stable.  Recommended f/u in one year.

## 2018-01-04 NOTE — Assessment & Plan Note (Signed)
S/p biopsy.  Followed at [facility].  Recheck liver panel and pt/inr.  Has f/u planned in 01/2018.

## 2018-01-04 NOTE — Assessment & Plan Note (Signed)
Have her spot check her pressure.  Schedule f/u - to f/u on her blood pressure.

## 2018-02-18 LAB — BASIC METABOLIC PANEL
BUN: 13 (ref 4–21)
Creatinine: 0.7 (ref 0.5–1.1)
Glucose: 90
POTASSIUM: 4.2 (ref 3.4–5.3)
SODIUM: 139 (ref 137–147)

## 2018-02-18 LAB — PROTIME-INR: Protime: 10.8 (ref 10.0–13.8)

## 2018-02-18 LAB — POCT INR: INR: 0.9 (ref 0.9–1.1)

## 2018-02-18 LAB — HEPATIC FUNCTION PANEL
ALK PHOS: 95 (ref 25–125)
ALT: 28 (ref 7–35)
AST: 27 (ref 13–35)
Bilirubin, Total: 1

## 2018-05-01 ENCOUNTER — Ambulatory Visit: Payer: PRIVATE HEALTH INSURANCE | Admitting: Internal Medicine

## 2018-05-01 ENCOUNTER — Encounter: Payer: Self-pay | Admitting: Internal Medicine

## 2018-05-01 VITALS — BP 116/78 | HR 80 | Temp 97.8°F | Resp 15 | Ht 62.0 in | Wt 169.8 lb

## 2018-05-01 DIAGNOSIS — Z9109 Other allergy status, other than to drugs and biological substances: Secondary | ICD-10-CM | POA: Diagnosis not present

## 2018-05-01 DIAGNOSIS — C539 Malignant neoplasm of cervix uteri, unspecified: Secondary | ICD-10-CM

## 2018-05-01 DIAGNOSIS — D869 Sarcoidosis, unspecified: Secondary | ICD-10-CM | POA: Diagnosis not present

## 2018-05-01 DIAGNOSIS — R748 Abnormal levels of other serum enzymes: Secondary | ICD-10-CM

## 2018-05-01 DIAGNOSIS — R030 Elevated blood-pressure reading, without diagnosis of hypertension: Secondary | ICD-10-CM

## 2018-05-01 NOTE — Progress Notes (Signed)
Patient ID: Diane French, female   DOB: 10/09/1960, 58 y.o.   MRN: [id number]   Subjective:    Patient ID: Diane French, female    DOB: 12/07/1960, 58 y.o.   MRN: [id number]  HPI  Patient here for a scheduled follow up.  She reports she is doing relatively well.  Tries to stay active.  On the ellipitical.  Exercises 4-5 days per week.  No chest pain.  No sob. No acid reflux.  No abdominal pain.  Bowels moving. No urine change.  Last colonoscopy 2010.  Recommended f/u in 10 years.  Will be due this year.  Was seen at [place].  Up to date with pap.  Last evaluated 11/05/17.  Requested copy of pap.  Has seen dermatology regarding her hair.  Has tried rogaine.  Request to have thyroid checked.     Past Medical History:  Diagnosis Date  . Abnormal Pap smear    HSIL  . Cancer [facility]) 2011   cervical cancer  . Endometriosis   . Environmental allergies   . H/O hemorrhoids   . Hx of dysmenorrhea   . Sarcoidosis    Past Surgical History:  Procedure Laterality Date  . ABDOMINAL HYSTERECTOMY  2011   partial, ovaries left in place  . CYSTECTOMY     parotid  . DILATION AND CURETTAGE OF UTERUS  1992   Miscarriage   . LAPAROSCOPY     Family History  Problem Relation Age of Onset  . Hypertension Mother   . Diabetes Mother   . Cancer Father        lung cancer  . Cancer Maternal Grandfather 92       prostate cancer  . Breast cancer Other        niece (dx age 55)   Social History   Socioeconomic History  . Marital status: Married    Spouse name: Not on file  . Number of children: 2  . Years of education: Not on file  . Highest education level: Not on file  Occupational History  . Occupation: Insurance claims handler: [employer]  Social Needs  . Financial resource strain: Not on file  . Food insecurity:    Worry: Not on file    Inability: Not on file  . Transportation needs:    Medical: Not on file    Non-medical: Not on file  Tobacco Use  . Smoking status:  Never Smoker  . Smokeless tobacco: Never Used  Substance and Sexual Activity  . Alcohol use: No    Alcohol/week: 0.0 standard drinks  . Drug use: No  . Sexual activity: Yes    Birth control/protection: Surgical    Comment: hyst  Lifestyle  . Physical activity:    Days per week: Not on file    Minutes per session: Not on file  . Stress: Not on file  Relationships  . Social connections:    Talks on phone: Not on file    Gets together: Not on file    Attends religious service: Not on file    Active member of club or organization: Not on file    Attends meetings of clubs or organizations: Not on file    Relationship status: Not on file  Other Topics Concern  . Not on file  Social History Narrative  . Not on file    Outpatient Encounter Medications as of 05/01/2018  Medication Sig  . levocetirizine (XYZAL) 5 MG tablet Take  5 mg by mouth every evening.   No facility-administered encounter medications on file as of 05/01/2018.     Review of Systems  Constitutional: Negative for appetite change and unexpected weight change.  HENT: Negative for congestion and sinus pressure.   Respiratory: Negative for cough, chest tightness and shortness of breath.   Cardiovascular: Negative for chest pain, palpitations and leg swelling.  Gastrointestinal: Negative for abdominal pain, diarrhea and nausea.  Genitourinary: Negative for difficulty urinating and dysuria.  Musculoskeletal: Negative for joint swelling and myalgias.  Skin: Negative for color change and rash.  Neurological: Negative for dizziness, light-headedness and headaches.  Psychiatric/Behavioral: Negative for agitation and dysphoric mood.       Objective:    Physical Exam Constitutional:      General: She is not in acute distress.    Appearance: Normal appearance.  HENT:     Nose: Nose normal. No congestion.     Mouth/Throat:     Pharynx: No oropharyngeal exudate or posterior oropharyngeal erythema.  Neck:      Musculoskeletal: Neck supple. No muscular tenderness.     Thyroid: No thyromegaly.  Cardiovascular:     Rate and Rhythm: Normal rate and regular rhythm.  Pulmonary:     Effort: No respiratory distress.     Breath sounds: Normal breath sounds. No wheezing.  Abdominal:     General: Bowel sounds are normal.     Palpations: Abdomen is soft.     Tenderness: There is no abdominal tenderness.  Musculoskeletal:        General: No swelling or tenderness.  Lymphadenopathy:     Cervical: No cervical adenopathy.  Skin:    Findings: No erythema or rash.  Neurological:     Mental Status: She is alert.  Psychiatric:        Mood and Affect: Mood normal.        Behavior: Behavior normal.     BP 116/78 (BP Location: Right Arm, Patient Position: Sitting, Cuff Size: Normal)   Pulse 80   Temp 97.8 F (36.6 C) (Oral)   Resp 15   Ht 5\' 2"  (1.575 m)   Wt 169 lb 12.8 oz (77 kg)   SpO2 96%   BMI 31.06 kg/m  Wt Readings from Last 3 Encounters:  05/01/18 169 lb 12.8 oz (77 kg)  01/01/18 166 lb 9.6 oz (75.6 kg)  01/11/16 160 lb 6.4 oz (72.8 kg)     Lab Results  Component Value Date   WBC 5.3 01/01/2018   HGB 14.8 01/01/2018   HCT 45.1 01/01/2018   PLT 216.0 01/01/2018   GLUCOSE 87 01/01/2018   CHOL 167 05/18/2014   TRIG 87.0 05/18/2014   HDL 60.80 05/18/2014   LDLCALC 89 05/18/2014   ALT 28 02/18/2018   AST 27 02/18/2018   NA 139 02/18/2018   K 4.2 02/18/2018   CL 103 01/01/2018   CREATININE 0.7 02/18/2018   BUN 13 02/18/2018   CO2 32 01/01/2018   TSH 2.80 08/25/2012   INR 0.9 02/18/2018       Assessment & Plan:   Problem List Items Addressed This Visit    Cervical cancer ([place])    S/p TVH.  Followed by GYN.  Last evaluated 10/2017.        Elevated alkaline phosphatase level    S/p biopsy.  Followed at [facility].  Follow liver panel.        Environmental allergies    On xyzal.  Controls.  Sarcoidosis    Followed by Dr Raul Del.  Last evaluated 09/2017.  Stable.   Recommended f/u in one year.        Transient elevated blood pressure - Primary    Blood pressure varies.  Have her spot check her pressure and send in readings.  Follow metabolic panel.       Relevant Orders   TSH   Vitamin D (25 hydroxy)       Einar Pheasant, MD

## 2018-05-03 ENCOUNTER — Encounter: Payer: Self-pay | Admitting: Internal Medicine

## 2018-05-03 NOTE — Assessment & Plan Note (Signed)
S/p biopsy.  Followed at [facility].  Follow liver panel.

## 2018-05-03 NOTE — Assessment & Plan Note (Signed)
Blood pressure varies.  Have her spot check her pressure and send in readings.  Follow metabolic panel.

## 2018-05-03 NOTE — Assessment & Plan Note (Signed)
On xyzal.  Controls.

## 2018-05-03 NOTE — Assessment & Plan Note (Signed)
Followed by Dr Raul Del.  Last evaluated 09/2017.  Stable.  Recommended f/u in one year.

## 2018-05-03 NOTE — Assessment & Plan Note (Signed)
S/p TVH.  Followed by GYN.  Last evaluated 10/2017.

## 2018-05-04 ENCOUNTER — Other Ambulatory Visit: Payer: PRIVATE HEALTH INSURANCE

## 2018-10-29 ENCOUNTER — Telehealth: Payer: Self-pay

## 2018-10-29 NOTE — Telephone Encounter (Signed)
Appt cancelled

## 2018-10-29 NOTE — Telephone Encounter (Signed)
Copied from [place] [id number]. Topic: Quick Communication - Appointment Cancellation >> Oct 29, 2018  8:41 AM Rainey Pines A wrote: Patient called to cancel appointment scheduled for 10/30/2018. Patient will call back to reschedule and couldn't get off of work.

## 2018-10-30 ENCOUNTER — Ambulatory Visit: Payer: PRIVATE HEALTH INSURANCE | Admitting: Internal Medicine

## 2018-11-14 ENCOUNTER — Other Ambulatory Visit: Payer: Self-pay

## 2018-11-14 DIAGNOSIS — Z20822 Contact with and (suspected) exposure to covid-19: Secondary | ICD-10-CM

## 2018-11-15 LAB — NOVEL CORONAVIRUS, NAA: SARS-CoV-2, NAA: NOT DETECTED

## 2019-03-23 ENCOUNTER — Telehealth: Payer: Self-pay | Admitting: Internal Medicine

## 2019-03-23 NOTE — Telephone Encounter (Signed)
Patient called and wanted to know if Dr. Nicki Reaper received a fax from Dr. Loletha Grayer, from [facility]. Per patient Dr. Nicki Reaper said she would put in the lab orders so pt could come into office to do labs at our location.

## 2019-03-25 NOTE — Telephone Encounter (Signed)
Pt does not need these labs until February. She is going to get orders from Dr Loletha Grayer and mail them to office or send via my chart so I can order then will set up lab appt.

## 2019-04-05 ENCOUNTER — Telehealth: Payer: Self-pay | Admitting: Internal Medicine

## 2019-04-05 NOTE — Telephone Encounter (Signed)
Left detailed message for patient.

## 2019-04-05 NOTE — Telephone Encounter (Signed)
Pt wants to know if its ok for her to gt the covid vaccine. Pt wanted to know Dr Nicki Reaper thoughts about her getting it. The vaccine is available for pt to get today. Please advise and Thank you!  Call pt @ [phone]. Its ok to leave msg on vm.

## 2019-04-05 NOTE — Telephone Encounter (Signed)
Notify pt that I am not aware of any contraindication, but would recommend she call her pulmonologist and confirm that they agree .

## 2019-04-30 ENCOUNTER — Encounter: Payer: Self-pay | Admitting: Internal Medicine

## 2019-04-30 NOTE — Telephone Encounter (Signed)
Larena Glassman, do you have this order form?  I have not seen the form.

## 2019-05-05 NOTE — Telephone Encounter (Signed)
I received order form for INR and CMP. Are you okay with her having these done here?

## 2019-05-05 NOTE — Telephone Encounter (Signed)
I am ok to order these labs and I would also like - if she is agreeable to order cbc, tsh and vitamin D level.  Let me now if agreeable and I can place order.  I am not sure what appt she is talking about that we cancelled, but we can get an appt rescheduled.

## 2019-05-07 NOTE — Telephone Encounter (Signed)
Pt okay to do these labs that you are requesting. I can order- just need dx code for vit D. She is scheduled for labs 2/10. Advised that she can do f/u with you within a week of having her labs done and she can discuss results with you. She asked about being seen in office but stated that she would do a virtual if you prefer her do that. I will call her back to schedule an appt with you.

## 2019-05-07 NOTE — Telephone Encounter (Signed)
Can use vitamin D deficiency for dx code for vitamin D.  Ok to schedule appt.

## 2019-05-10 ENCOUNTER — Telehealth: Payer: Self-pay

## 2019-05-10 ENCOUNTER — Other Ambulatory Visit: Payer: Self-pay

## 2019-05-10 DIAGNOSIS — E559 Vitamin D deficiency, unspecified: Secondary | ICD-10-CM

## 2019-05-10 DIAGNOSIS — D8689 Sarcoidosis of other sites: Secondary | ICD-10-CM

## 2019-05-10 DIAGNOSIS — D869 Sarcoidosis, unspecified: Secondary | ICD-10-CM

## 2019-05-10 DIAGNOSIS — R748 Abnormal levels of other serum enzymes: Secondary | ICD-10-CM

## 2019-05-10 NOTE — Telephone Encounter (Signed)
Future lab orders placed

## 2019-05-11 NOTE — Telephone Encounter (Signed)
Made note on schedule to schedule f/u with Dr Nicki Reaper.

## 2019-05-12 ENCOUNTER — Other Ambulatory Visit (INDEPENDENT_AMBULATORY_CARE_PROVIDER_SITE_OTHER): Payer: PRIVATE HEALTH INSURANCE

## 2019-05-12 ENCOUNTER — Other Ambulatory Visit: Payer: Self-pay

## 2019-05-12 DIAGNOSIS — D8689 Sarcoidosis of other sites: Secondary | ICD-10-CM | POA: Diagnosis not present

## 2019-05-12 DIAGNOSIS — E559 Vitamin D deficiency, unspecified: Secondary | ICD-10-CM

## 2019-05-12 DIAGNOSIS — D869 Sarcoidosis, unspecified: Secondary | ICD-10-CM | POA: Diagnosis not present

## 2019-05-12 DIAGNOSIS — R748 Abnormal levels of other serum enzymes: Secondary | ICD-10-CM

## 2019-05-12 NOTE — Addendum Note (Signed)
Addended by: Leeanne Rio on: 05/12/2019 04:05 PM   Modules accepted: Orders

## 2019-05-13 LAB — COMPREHENSIVE METABOLIC PANEL
ALT: 16 U/L (ref 0–35)
AST: 21 U/L (ref 0–37)
Albumin: 4.2 g/dL (ref 3.5–5.2)
Alkaline Phosphatase: 82 U/L (ref 39–117)
BUN: 14 mg/dL (ref 6–23)
CO2: 26 mEq/L (ref 19–32)
Calcium: 9.5 mg/dL (ref 8.4–10.5)
Chloride: 104 mEq/L (ref 96–112)
Creatinine, Ser: 0.67 mg/dL (ref 0.40–1.20)
GFR: 109.35 mL/min (ref >60.00)
Glucose, Bld: 82 mg/dL (ref 70–99)
Potassium: 3.7 mEq/L (ref 3.5–5.1)
Sodium: 139 mEq/L (ref 135–145)
Total Bilirubin: 1 mg/dL (ref 0.2–1.2)
Total Protein: 7.4 g/dL (ref 6.0–8.3)

## 2019-05-13 LAB — CBC WITH DIFFERENTIAL/PLATELET
Basophils Absolute: 0.1 10*3/uL (ref 0.0–0.1)
Basophils Relative: 1.1 % (ref 0.0–3.0)
Eosinophils Absolute: 0.1 10*3/uL (ref 0.0–0.7)
Eosinophils Relative: 1.6 % (ref 0.0–5.0)
HCT: 41.6 % (ref 36.0–46.0)
Hemoglobin: 13.8 g/dL (ref 12.0–15.0)
Lymphocytes Relative: 23.1 % (ref 12.0–46.0)
Lymphs Abs: 1.6 10*3/uL (ref 0.7–4.0)
MCHC: 33.2 g/dL (ref 30.0–36.0)
MCV: 90.5 fl (ref 78.0–100.0)
Monocytes Absolute: 0.5 10*3/uL (ref 0.1–1.0)
Monocytes Relative: 7.8 % (ref 3.0–12.0)
Neutro Abs: 4.6 10*3/uL (ref 1.4–7.7)
Neutrophils Relative %: 66.4 % (ref 43.0–77.0)
Platelets: 208 10*3/uL (ref 150.0–400.0)
RBC: 4.6 Mil/uL (ref 3.87–5.11)
RDW: 13.6 % (ref 11.5–15.5)
WBC: 6.9 10*3/uL (ref 4.0–10.5)

## 2019-05-13 LAB — PROTIME-INR
INR: 1 (ref 0.9–1.2)
Prothrombin Time: 10.3 s (ref 9.1–12.0)

## 2019-05-13 LAB — VITAMIN D 25 HYDROXY (VIT D DEFICIENCY, FRACTURES): VITD: 23.95 ng/mL — ABNORMAL LOW (ref 30.00–100.00)

## 2019-05-13 LAB — TSH: TSH: 2.75 u[IU]/mL (ref 0.35–4.50)

## 2019-05-19 ENCOUNTER — Telehealth: Payer: Self-pay | Admitting: *Deleted

## 2019-05-19 NOTE — Telephone Encounter (Signed)
Pt moved OV from 2/18-3/2. Pt will need lab results before next appt.

## 2019-05-20 ENCOUNTER — Ambulatory Visit: Payer: PRIVATE HEALTH INSURANCE | Admitting: Internal Medicine

## 2019-05-20 NOTE — Telephone Encounter (Signed)
Notify pt that her hgb, kidney function tests and liver function tests are wnl.  Vitamin  d - decreased.  If not taking any vitamin D, then recommend starting vitamin D3 1000 units per day.

## 2019-05-20 NOTE — Telephone Encounter (Signed)
Patient aware of results.

## 2019-05-27 ENCOUNTER — Ambulatory Visit: Payer: PRIVATE HEALTH INSURANCE | Admitting: Internal Medicine

## 2019-06-01 ENCOUNTER — Other Ambulatory Visit: Payer: Self-pay

## 2019-06-01 ENCOUNTER — Encounter: Payer: Self-pay | Admitting: Internal Medicine

## 2019-06-01 ENCOUNTER — Ambulatory Visit (INDEPENDENT_AMBULATORY_CARE_PROVIDER_SITE_OTHER): Payer: PRIVATE HEALTH INSURANCE | Admitting: Internal Medicine

## 2019-06-01 DIAGNOSIS — R030 Elevated blood-pressure reading, without diagnosis of hypertension: Secondary | ICD-10-CM

## 2019-06-01 DIAGNOSIS — Z8541 Personal history of malignant neoplasm of cervix uteri: Secondary | ICD-10-CM

## 2019-06-01 DIAGNOSIS — M542 Cervicalgia: Secondary | ICD-10-CM

## 2019-06-01 DIAGNOSIS — Z1211 Encounter for screening for malignant neoplasm of colon: Secondary | ICD-10-CM

## 2019-06-01 DIAGNOSIS — D869 Sarcoidosis, unspecified: Secondary | ICD-10-CM | POA: Diagnosis not present

## 2019-06-01 DIAGNOSIS — R748 Abnormal levels of other serum enzymes: Secondary | ICD-10-CM

## 2019-06-01 DIAGNOSIS — Z9109 Other allergy status, other than to drugs and biological substances: Secondary | ICD-10-CM

## 2019-06-01 NOTE — Progress Notes (Signed)
Patient ID: Diane French, female   DOB: 1960/10/30, 59 y.o.   MRN: [id number]   Subjective:    Patient ID: Diane French, female    DOB: 10/09/1960, 59 y.o.   MRN: [id number]  HPI  Patient here for a scheduled follow up. She reports she is doing relatively well.  Followed by Dr Raul Del for sarcoid.  Last evaluated 09/2018.  Stable.  Recommended f/u in one year.  Has eye appt coming up.  Sees gyn.  Tries to stay active.  No chest pain or sob reported.  No abdominal pain or acid reflux reported.  Some pain - post left neck.  Discussed sleeping posture and posture at work.  No significant headache.  No dizziness or light headedness.  Due colonoscopy.  Handling stress.  Working.  Family doing well.     Past Medical History:  Diagnosis Date  . Abnormal Pap smear    HSIL  . Cancer [facility]) 2011   cervical cancer  . Endometriosis   . Environmental allergies   . H/O hemorrhoids   . Hx of dysmenorrhea   . Sarcoidosis    Past Surgical History:  Procedure Laterality Date  . ABDOMINAL HYSTERECTOMY  2011   partial, ovaries left in place  . CYSTECTOMY     parotid  . DILATION AND CURETTAGE OF UTERUS  1992   Miscarriage   . LAPAROSCOPY     Family History  Problem Relation Age of Onset  . Hypertension Mother   . Diabetes Mother   . Cancer Father        lung cancer  . Cancer Maternal Grandfather 92       prostate cancer  . Breast cancer Other        niece (dx age 76)   Social History   Socioeconomic History  . Marital status: Married    Spouse name: Not on file  . Number of children: 2  . Years of education: Not on file  . Highest education level: Not on file  Occupational History  . Occupation: Insurance claims handler: [employer]  Tobacco Use  . Smoking status: Never Smoker  . Smokeless tobacco: Never Used  Substance and Sexual Activity  . Alcohol use: No    Alcohol/week: 0.0 standard drinks  . Drug use: No  . Sexual activity: Yes    Birth control/protection:  Surgical    Comment: hyst  Other Topics Concern  . Not on file  Social History Narrative  . Not on file   Social Determinants of Health   Financial Resource Strain:   . Difficulty of Paying Living Expenses: Not on file  Food Insecurity:   . Worried About Charity fundraiser in the Last Year: Not on file  . Ran Out of Food in the Last Year: Not on file  Transportation Needs:   . Lack of Transportation (Medical): Not on file  . Lack of Transportation (Non-Medical): Not on file  Physical Activity:   . Days of Exercise per Week: Not on file  . Minutes of Exercise per Session: Not on file  Stress:   . Feeling of Stress : Not on file  Social Connections:   . Frequency of Communication with Friends and Family: Not on file  . Frequency of Social Gatherings with Friends and Family: Not on file  . Attends Religious Services: Not on file  . Active Member of Clubs or Organizations: Not on file  . Attends Archivist  Meetings: Not on file  . Marital Status: Not on file    Outpatient Encounter Medications as of 06/01/2019  Medication Sig  . levocetirizine (XYZAL) 5 MG tablet Take 5 mg by mouth every evening.   No facility-administered encounter medications on file as of 06/01/2019.    Review of Systems  Constitutional: Negative for appetite change and unexpected weight change.  HENT: Negative for congestion and sinus pressure.   Respiratory: Negative for cough, chest tightness and shortness of breath.   Cardiovascular: Negative for chest pain, palpitations and leg swelling.  Gastrointestinal: Negative for abdominal pain, diarrhea, nausea and vomiting.  Genitourinary: Negative for difficulty urinating and dysuria.  Musculoskeletal: Positive for neck pain. Negative for joint swelling and myalgias.  Skin: Negative for color change and rash.  Neurological: Negative for dizziness, light-headedness and headaches.  Psychiatric/Behavioral: Negative for agitation and dysphoric mood.        Objective:    Physical Exam Constitutional:      General: She is not in acute distress.    Appearance: Normal appearance.  HENT:     Head: Normocephalic and atraumatic.     Right Ear: External ear normal.     Left Ear: External ear normal.  Eyes:     General: No scleral icterus.       Right eye: No discharge.        Left eye: No discharge.     Conjunctiva/sclera: Conjunctivae normal.  Neck:     Thyroid: No thyromegaly.  Cardiovascular:     Rate and Rhythm: Normal rate and regular rhythm.  Pulmonary:     Effort: No respiratory distress.     Breath sounds: Normal breath sounds. No wheezing.  Abdominal:     General: Bowel sounds are normal.     Palpations: Abdomen is soft.     Tenderness: There is no abdominal tenderness.  Musculoskeletal:        General: No swelling or tenderness.     Cervical back: Neck supple. No tenderness.  Lymphadenopathy:     Cervical: No cervical adenopathy.  Skin:    Findings: No erythema or rash.  Neurological:     Mental Status: She is alert and oriented to person, place, and time.  Psychiatric:        Mood and Affect: Mood normal.        Behavior: Behavior normal.     BP 140/80   Pulse 79   Temp (!) 97 F (36.1 C)   Resp 16   Wt 167 lb (75.8 kg)   SpO2 98%   BMI 30.54 kg/m  Wt Readings from Last 3 Encounters:  06/01/19 167 lb (75.8 kg)  05/01/18 169 lb 12.8 oz (77 kg)  01/01/18 166 lb 9.6 oz (75.6 kg)     Lab Results  Component Value Date   WBC 6.9 05/12/2019   HGB 13.8 05/12/2019   HCT 41.6 05/12/2019   PLT 208.0 05/12/2019   GLUCOSE 82 05/12/2019   CHOL 167 05/18/2014   TRIG 87.0 05/18/2014   HDL 60.80 05/18/2014   LDLCALC 89 05/18/2014   ALT 16 05/12/2019   AST 21 05/12/2019   NA 139 05/12/2019   K 3.7 05/12/2019   CL 104 05/12/2019   CREATININE 0.67 05/12/2019   BUN 14 05/12/2019   CO2 26 05/12/2019   TSH 2.75 05/12/2019   INR 1.0 05/12/2019       Assessment & Plan:   Problem List Items  Addressed This Visit    Colon  cancer screening    Colonoscopy 2010 - due.  Needs to schedule f/u colonoscopy.        Elevated alkaline phosphatase level    Most recent liver panel - alkaline phos - wnl.        Elevated blood pressure reading    Blood pressure elevated.  Have her spot check her pressure.  Send in readings.  If persistent elevation, will require medication.  Follow.        Environmental allergies    xyzal - has controlled.  No significant symptoms reported today.        History of cervical cancer    S/p TVH.  Sees gyn.  Up to date with pap smears.       Posterior neck pain    Left posterior neck pain.  Discussed posture, stretches, etc.  Follow.  Notify me if persistent problems.        Sarcoidosis    Followed by Dr Raul Del.  Stable.            Einar Pheasant, MD

## 2019-06-06 ENCOUNTER — Encounter: Payer: Self-pay | Admitting: Internal Medicine

## 2019-06-06 DIAGNOSIS — Z1211 Encounter for screening for malignant neoplasm of colon: Secondary | ICD-10-CM | POA: Insufficient documentation

## 2019-06-06 DIAGNOSIS — M542 Cervicalgia: Secondary | ICD-10-CM | POA: Insufficient documentation

## 2019-06-06 DIAGNOSIS — R030 Elevated blood-pressure reading, without diagnosis of hypertension: Secondary | ICD-10-CM | POA: Insufficient documentation

## 2019-06-06 NOTE — Assessment & Plan Note (Signed)
Colonoscopy 2010 - due.  Needs to schedule f/u colonoscopy.

## 2019-06-06 NOTE — Assessment & Plan Note (Signed)
Followed by Dr Raul Del.  Stable.

## 2019-06-06 NOTE — Assessment & Plan Note (Signed)
xyzal - has controlled.  No significant symptoms reported today.

## 2019-06-06 NOTE — Assessment & Plan Note (Signed)
S/p TVH.  Sees gyn.  Up to date with pap smears.

## 2019-06-06 NOTE — Assessment & Plan Note (Signed)
Most recent liver panel - alkaline phos - wnl.

## 2019-06-06 NOTE — Assessment & Plan Note (Signed)
Left posterior neck pain.  Discussed posture, stretches, etc.  Follow.  Notify me if persistent problems.

## 2019-06-06 NOTE — Assessment & Plan Note (Signed)
Blood pressure elevated.  Have her spot check her pressure.  Send in readings.  If persistent elevation, will require medication.  Follow.

## 2020-04-20 LAB — LIPID PANEL
Cholesterol: 207 — AB (ref 0–200)
HDL: 74 — AB (ref 35–70)
LDL Cholesterol: 120
Triglycerides: 62 (ref 40–160)

## 2020-04-20 LAB — BASIC METABOLIC PANEL
BUN: 11 (ref 4–21)
Creatinine: 0.8 (ref 0.5–1.1)

## 2020-04-20 LAB — HEMOGLOBIN A1C: Hemoglobin A1C: 5.5

## 2020-04-20 LAB — HEPATIC FUNCTION PANEL
ALT: 16 (ref 7–35)
AST: 23 (ref 13–35)
Alkaline Phosphatase: 102 (ref 25–125)
Bilirubin, Total: 1.2

## 2020-04-20 LAB — COMPREHENSIVE METABOLIC PANEL
Albumin: 4.3 (ref 3.5–5.0)
Globulin: 3.1

## 2020-06-12 ENCOUNTER — Telehealth: Payer: Self-pay

## 2020-06-12 NOTE — Telephone Encounter (Signed)
Pt states that she was denied life insurance policy because out office did not send back the forms from [facility]. I asked her if she brought them in or if they were faxed. She states that she does not know how they were sent in but [facility] received their forms for it. Pt would like a call back ASAP

## 2020-06-13 NOTE — Telephone Encounter (Signed)
Left detailed message for patient.

## 2020-06-13 NOTE — Telephone Encounter (Signed)
LMTCB

## 2020-06-13 NOTE — Telephone Encounter (Signed)
Patient returned office phone call. Patient works for a [employer], she will try to get to her phone.

## 2020-06-13 NOTE — Telephone Encounter (Signed)
Pt stated that [facility] has sent over medical records request in order to increase life insurance policy. I have not received this. She is going to have them resend to my attention so I can review and send to ciox if needed.

## 2020-06-23 LAB — HM MAMMOGRAPHY

## 2020-08-20 LAB — HM PAP SMEAR: HPV, high-risk: NEGATIVE

## 2020-08-24 ENCOUNTER — Ambulatory Visit (INDEPENDENT_AMBULATORY_CARE_PROVIDER_SITE_OTHER): Payer: PRIVATE HEALTH INSURANCE | Admitting: Internal Medicine

## 2020-08-24 ENCOUNTER — Other Ambulatory Visit: Payer: Self-pay

## 2020-08-24 DIAGNOSIS — D869 Sarcoidosis, unspecified: Secondary | ICD-10-CM

## 2020-08-24 DIAGNOSIS — Z9109 Other allergy status, other than to drugs and biological substances: Secondary | ICD-10-CM

## 2020-08-24 DIAGNOSIS — Z8541 Personal history of malignant neoplasm of cervix uteri: Secondary | ICD-10-CM | POA: Diagnosis not present

## 2020-08-24 DIAGNOSIS — Z1211 Encounter for screening for malignant neoplasm of colon: Secondary | ICD-10-CM

## 2020-08-24 DIAGNOSIS — R748 Abnormal levels of other serum enzymes: Secondary | ICD-10-CM | POA: Diagnosis not present

## 2020-08-24 DIAGNOSIS — M79671 Pain in right foot: Secondary | ICD-10-CM

## 2020-08-24 NOTE — Progress Notes (Signed)
Patient ID: Diane French, female   DOB: 1961-02-05, 60 y.o.   MRN: [id number]   Subjective:    Patient ID: Diane French, female    DOB: 15-Apr-1960, 60 y.o.   MRN: [id number]  HPI This visit occurred during the SARS-CoV-2 public health emergency.  Safety protocols were in place, including screening questions prior to the visit, additional usage of staff PPE, and extensive cleaning of exam room while observing appropriate contact time as indicated for disinfecting solutions.  Patient here for a scheduled follow up.  Followed by pulmonary for dormant sarcoid.  Recommended to continue to observe and f/u in one year.  Breathing stable.  No increased cough or congestion.  No chest pain or sob reported.  No abdominal pain.  Planning for colonoscopy - Dr Arnette Norris.  Having right heel pain.  Request referral to podiatry.  Discussed supports.  Handling stress.    Past Medical History:  Diagnosis Date  . Abnormal Pap smear    HSIL  . Cancer [facility]) 2011   cervical cancer  . Endometriosis   . Environmental allergies   . H/O hemorrhoids   . Hx of dysmenorrhea   . Sarcoidosis    Past Surgical History:  Procedure Laterality Date  . ABDOMINAL HYSTERECTOMY  2011   partial, ovaries left in place  . CYSTECTOMY     parotid  . DILATION AND CURETTAGE OF UTERUS  1992   Miscarriage   . LAPAROSCOPY     Family History  Problem Relation Age of Onset  . Hypertension Mother   . Diabetes Mother   . Cancer Father        lung cancer  . Cancer Maternal Grandfather 92       prostate cancer  . Breast cancer Other        niece (dx age 26)   Social History   Socioeconomic History  . Marital status: Married    Spouse name: Not on file  . Number of children: 2  . Years of education: Not on file  . Highest education level: Not on file  Occupational History  . Occupation: Insurance claims handler: [employer]  Tobacco Use  . Smoking status: Never Smoker  . Smokeless tobacco: Never Used  Substance  and Sexual Activity  . Alcohol use: No    Alcohol/week: 0.0 standard drinks  . Drug use: No  . Sexual activity: Yes    Birth control/protection: Surgical    Comment: hyst  Other Topics Concern  . Not on file  Social History Narrative  . Not on file   Social Determinants of Health   Financial Resource Strain: Not on file  Food Insecurity: Not on file  Transportation Needs: Not on file  Physical Activity: Not on file  Stress: Not on file  Social Connections: Not on file    Outpatient Encounter Medications as of 08/24/2020  Medication Sig  . levocetirizine (XYZAL) 5 MG tablet Take 5 mg by mouth every evening.   No facility-administered encounter medications on file as of 08/24/2020.    Review of Systems  Constitutional: Negative for appetite change and unexpected weight change.  HENT: Negative for congestion and sinus pressure.   Respiratory: Negative for cough, chest tightness and shortness of breath.   Cardiovascular: Negative for chest pain, palpitations and leg swelling.  Gastrointestinal: Negative for abdominal pain, diarrhea, nausea and vomiting.  Genitourinary: Negative for difficulty urinating and dysuria.  Musculoskeletal: Negative for joint swelling and myalgias.  Right heel pain as outlined.   Skin: Negative for color change and rash.  Neurological: Negative for dizziness, light-headedness and headaches.  Psychiatric/Behavioral: Negative for agitation and dysphoric mood.       Objective:    Physical Exam Vitals reviewed.  Constitutional:      General: She is not in acute distress.    Appearance: Normal appearance.  HENT:     Head: Normocephalic and atraumatic.     Right Ear: External ear normal.     Left Ear: External ear normal.  Eyes:     General: No scleral icterus.       Right eye: No discharge.        Left eye: No discharge.     Conjunctiva/sclera: Conjunctivae normal.  Neck:     Thyroid: No thyromegaly.  Cardiovascular:     Rate and  Rhythm: Normal rate and regular rhythm.  Pulmonary:     Effort: No respiratory distress.     Breath sounds: Normal breath sounds. No wheezing.  Abdominal:     General: Bowel sounds are normal.     Palpations: Abdomen is soft.     Tenderness: There is no abdominal tenderness.  Musculoskeletal:        General: No swelling or tenderness.     Cervical back: Neck supple. No tenderness.  Lymphadenopathy:     Cervical: No cervical adenopathy.  Skin:    Findings: No erythema or rash.  Neurological:     Mental Status: She is alert.  Psychiatric:        Mood and Affect: Mood normal.        Behavior: Behavior normal.     BP 132/80   Pulse 92   Temp 97.8 F (36.6 C)   Resp 16   Ht 5\' 2"  (1.575 m)   Wt 167 lb (75.8 kg)   SpO2 98%   BMI 30.54 kg/m  Wt Readings from Last 3 Encounters:  08/24/20 167 lb (75.8 kg)  06/01/19 167 lb (75.8 kg)  05/01/18 169 lb 12.8 oz (77 kg)     Lab Results  Component Value Date   WBC 6.9 05/12/2019   HGB 13.8 05/12/2019   HCT 41.6 05/12/2019   PLT 208.0 05/12/2019   GLUCOSE 82 05/12/2019   CHOL 207 (A) 04/20/2020   TRIG 62 04/20/2020   HDL 74 (A) 04/20/2020   LDLCALC 120 04/20/2020   ALT 16 04/20/2020   AST 23 04/20/2020   NA 139 05/12/2019   K 3.7 05/12/2019   CL 104 05/12/2019   CREATININE 0.8 04/20/2020   BUN 11 04/20/2020   CO2 26 05/12/2019   TSH 2.75 05/12/2019   INR 1.0 05/12/2019   HGBA1C 5.5 04/20/2020       Assessment & Plan:   Problem List Items Addressed This Visit    Colon cancer screening    Due f/u colonoscopy - planning to have - Dr Arnette Norris.       Elevated alkaline phosphatase level    Follow liver panel.        Environmental allergies    Stable.  Xyzal.       History of cervical cancer    S/p TVH.  Followed by gyn.       Pain of right heel    Persistent right heel pain.  Discussed referral to podiatry.        Relevant Orders   Ambulatory referral to Podiatry   Sarcoidosis    Dormant sarcoid.   Followed  by Dr Raul Del.  Stable.  Recommended f/u in one year.            Einar Pheasant, MD

## 2020-08-28 ENCOUNTER — Encounter: Payer: Self-pay | Admitting: Internal Medicine

## 2020-08-28 ENCOUNTER — Telehealth: Payer: Self-pay | Admitting: Internal Medicine

## 2020-08-28 DIAGNOSIS — M79671 Pain in right foot: Secondary | ICD-10-CM | POA: Insufficient documentation

## 2020-08-28 NOTE — Telephone Encounter (Signed)
My chart message sent to pat

## 2020-08-28 NOTE — Assessment & Plan Note (Signed)
Due f/u colonoscopy - planning to have - Dr Arnette Norris.

## 2020-08-28 NOTE — Assessment & Plan Note (Signed)
Persistent right heel pain.  Discussed referral to podiatry.

## 2020-08-28 NOTE — Assessment & Plan Note (Signed)
Follow liver panel.  

## 2020-08-28 NOTE — Assessment & Plan Note (Signed)
S/p TVH.  Followed by gyn.

## 2020-08-28 NOTE — Assessment & Plan Note (Signed)
Stable.  Xyzal.

## 2020-08-28 NOTE — Assessment & Plan Note (Signed)
Dormant sarcoid.  Followed by Dr Raul Del.  Stable.  Recommended f/u in one year.

## 2020-10-17 ENCOUNTER — Ambulatory Visit (INDEPENDENT_AMBULATORY_CARE_PROVIDER_SITE_OTHER): Payer: PRIVATE HEALTH INSURANCE

## 2020-10-17 ENCOUNTER — Ambulatory Visit: Payer: PRIVATE HEALTH INSURANCE | Admitting: Podiatry

## 2020-10-17 ENCOUNTER — Other Ambulatory Visit: Payer: Self-pay

## 2020-10-17 DIAGNOSIS — M722 Plantar fascial fibromatosis: Secondary | ICD-10-CM | POA: Diagnosis not present

## 2020-10-17 DIAGNOSIS — M67449 Ganglion, unspecified hand: Secondary | ICD-10-CM | POA: Insufficient documentation

## 2020-10-17 MED ORDER — BETAMETHASONE SOD PHOS & ACET 6 (3-3) MG/ML IJ SUSP
3.0000 mg | Freq: Once | INTRAMUSCULAR | Status: AC
  Administered 2020-10-17: 3 mg via INTRA_ARTICULAR

## 2020-10-17 MED ORDER — METHYLPREDNISOLONE 4 MG PO TBPK: ORAL_TABLET | ORAL | 0 refills | Status: DC

## 2020-10-17 MED ORDER — MELOXICAM 15 MG PO TABS: 15.0000 mg | ORAL_TABLET | Freq: Every day | ORAL | 1 refills | Status: DC

## 2020-10-17 NOTE — Progress Notes (Signed)
   Subjective: 60 y.o. female presenting to the office today for evaluation of right heel pain is been going on for approximately 3 to 6 months now.  She has been having heel pain off and on with occasional intermittent soreness.  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  She presents for further treatment and evaluation.  Currently she has not done anything for treatment   Past Medical History:  Diagnosis Date   Abnormal Pap smear    HSIL   Cancer ([place]) 2011   cervical cancer   Endometriosis    Environmental allergies    H/O hemorrhoids    Hx of dysmenorrhea    Sarcoidosis      Objective: Physical Exam General: The patient is alert and oriented x3 in no acute distress.  Dermatology: Skin is warm, dry and supple bilateral lower extremities. Negative for open lesions or macerations bilateral.   Vascular: Dorsalis Pedis and Posterior Tibial pulses palpable bilateral.  Capillary fill time is immediate to all digits.  Neurological: Epicritic and protective threshold intact bilateral.   Musculoskeletal: Tenderness to palpation to the plantar aspect of the right heel along the plantar fascia. All other joints range of motion within normal limits bilateral. Strength 5/5 in all groups bilateral.   Radiographic exam: Normal osseous mineralization. Joint spaces preserved. No fracture/dislocation/boney destruction. No other soft tissue abnormalities or radiopaque foreign bodies.   Assessment: 1. Plantar fasciitis right  Plan of Care:  1. Patient evaluated. Xrays reviewed.   2. Injection of 0.5cc Celestone soluspan injected into the right plantar fascia  3. Rx for Medrol Dose Pack placed 4. Rx for Meloxicam ordered for patient. 5. Plantar fascial band(s) dispensed 6. Instructed patient regarding therapies and modalities at home to alleviate symptoms.  7. Return to clinic in 4 weeks.    *Pediatric dental assistant   Edrick Kins, DPM [facility]  Dr. Edrick Kins, DPM    [address], [place] [address]                Office [phone]  Fax [phone]

## 2020-10-24 ENCOUNTER — Encounter: Payer: Self-pay | Admitting: Internal Medicine

## 2020-10-27 ENCOUNTER — Ambulatory Visit: Payer: PRIVATE HEALTH INSURANCE | Admitting: Internal Medicine

## 2020-11-17 ENCOUNTER — Ambulatory Visit: Payer: PRIVATE HEALTH INSURANCE | Admitting: Podiatry

## 2021-01-12 ENCOUNTER — Other Ambulatory Visit: Payer: Self-pay

## 2021-01-12 ENCOUNTER — Other Ambulatory Visit: Payer: Self-pay | Admitting: Internal Medicine

## 2021-01-12 ENCOUNTER — Ambulatory Visit: Payer: PRIVATE HEALTH INSURANCE | Admitting: Internal Medicine

## 2021-01-12 ENCOUNTER — Other Ambulatory Visit
Admission: RE | Admit: 2021-01-12 | Discharge: 2021-01-12 | Disposition: A | Discharge status: Home / Self Care | Payer: PRIVATE HEALTH INSURANCE | Attending: Internal Medicine | Admitting: Internal Medicine

## 2021-01-12 VITALS — BP 122/76 | HR 88 | Temp 98.0°F | Resp 16 | Ht 62.0 in | Wt 169.2 lb

## 2021-01-12 DIAGNOSIS — Z1211 Encounter for screening for malignant neoplasm of colon: Secondary | ICD-10-CM

## 2021-01-12 DIAGNOSIS — D869 Sarcoidosis, unspecified: Secondary | ICD-10-CM

## 2021-01-12 DIAGNOSIS — E875 Hyperkalemia: Secondary | ICD-10-CM | POA: Diagnosis not present

## 2021-01-12 DIAGNOSIS — E559 Vitamin D deficiency, unspecified: Secondary | ICD-10-CM | POA: Diagnosis not present

## 2021-01-12 DIAGNOSIS — Z8541 Personal history of malignant neoplasm of cervix uteri: Secondary | ICD-10-CM

## 2021-01-12 DIAGNOSIS — R748 Abnormal levels of other serum enzymes: Secondary | ICD-10-CM | POA: Diagnosis not present

## 2021-01-12 LAB — POTASSIUM: Potassium: 3.5 mmol/L (ref 3.5–5.1)

## 2021-01-12 LAB — TSH: TSH: 2.54 u[IU]/mL (ref 0.35–5.50)

## 2021-01-12 LAB — BASIC METABOLIC PANEL
BUN: 10 mg/dL (ref 6–23)
CO2: 30 mEq/L (ref 19–32)
Calcium: 9.3 mg/dL (ref 8.4–10.5)
Chloride: 102 mEq/L (ref 96–112)
Creatinine, Ser: 0.56 mg/dL (ref 0.40–1.20)
GFR: 99.65 mL/min (ref >60.00)
Glucose, Bld: 83 mg/dL (ref 70–99)
Potassium: 5.6 mEq/L — ABNORMAL HIGH (ref 3.5–5.1)
Sodium: 137 mEq/L (ref 135–145)

## 2021-01-12 LAB — CBC WITH DIFFERENTIAL/PLATELET
Basophils Absolute: 0.1 10*3/uL (ref 0.0–0.1)
Basophils Relative: 1 % (ref 0.0–3.0)
Eosinophils Absolute: 0.2 10*3/uL (ref 0.0–0.7)
Eosinophils Relative: 3.9 % (ref 0.0–5.0)
HCT: 42.7 % (ref 36.0–46.0)
Hemoglobin: 14 g/dL (ref 12.0–15.0)
Lymphocytes Relative: 22.6 % (ref 12.0–46.0)
Lymphs Abs: 1.3 10*3/uL (ref 0.7–4.0)
MCHC: 32.6 g/dL (ref 30.0–36.0)
MCV: 91 fl (ref 78.0–100.0)
Monocytes Absolute: 0.4 10*3/uL (ref 0.1–1.0)
Monocytes Relative: 7.6 % (ref 3.0–12.0)
Neutro Abs: 3.8 10*3/uL (ref 1.4–7.7)
Neutrophils Relative %: 64.9 % (ref 43.0–77.0)
Platelets: 204 10*3/uL (ref 150.0–400.0)
RBC: 4.69 Mil/uL (ref 3.87–5.11)
RDW: 13.5 % (ref 11.5–15.5)
WBC: 5.9 10*3/uL (ref 4.0–10.5)

## 2021-01-12 LAB — LIPID PANEL
Cholesterol: 182 mg/dL (ref 0–200)
HDL: 69.8 mg/dL (ref >39.00)
LDL Cholesterol: 96 mg/dL (ref 0–99)
NonHDL: 112.68
Total CHOL/HDL Ratio: 3
Triglycerides: 83 mg/dL (ref 0.0–149.0)
VLDL: 16.6 mg/dL (ref 0.0–40.0)

## 2021-01-12 LAB — PROTIME-INR
INR: 1 ratio (ref 0.8–1.0)
Prothrombin Time: 11.1 s (ref 9.6–13.1)

## 2021-01-12 LAB — HEPATIC FUNCTION PANEL
ALT: 14 U/L (ref 0–35)
AST: 24 U/L (ref 0–37)
Albumin: 4.2 g/dL (ref 3.5–5.2)
Alkaline Phosphatase: 84 U/L (ref 39–117)
Bilirubin, Direct: 0.2 mg/dL (ref 0.0–0.3)
Total Bilirubin: 1.1 mg/dL (ref 0.2–1.2)
Total Protein: 6.9 g/dL (ref 6.0–8.3)

## 2021-01-12 LAB — VITAMIN D 25 HYDROXY (VIT D DEFICIENCY, FRACTURES): VITD: 35.05 ng/mL (ref 30.00–100.00)

## 2021-01-12 NOTE — Progress Notes (Signed)
Patient ID: Violet Baldy, female   DOB: [birth year], 60 y.o.   MRN: [id number]   Subjective:    Patient ID: Violet Baldy, female    DOB: [birth year], 60 y.o.   MRN: [id number]  This visit occurred during the SARS-CoV-2 public health emergency.  Safety protocols were in place, including screening questions prior to the visit, additional usage of staff PPE, and extensive cleaning of exam room while observing appropriate contact time as indicated for disinfecting solutions.   Patient here for a scheduled follow up.  Chief Complaint  Patient presents with   Hypertension   .   HPI Tries to stay active.  Has been seeing podiatry for f/u plantar fasciitis.  No chest pain or sob reported. No cough or congestion.  Working.  If stands for long periods - occasionally will notice left side pain.  Discussed colonoscopy (Dr Ronalee Red).  She will call to schedule.     Past Medical History:  Diagnosis Date   Abnormal Pap smear    HSIL   Cancer ([place]) 2011   cervical cancer   Endometriosis    Environmental allergies    H/O hemorrhoids    Hx of dysmenorrhea    Sarcoidosis    Past Surgical History:  Procedure Laterality Date   ABDOMINAL HYSTERECTOMY  2011   partial, ovaries left in place   CYSTECTOMY     parotid   DILATION AND CURETTAGE OF UTERUS  1992   Miscarriage    LAPAROSCOPY     Family History  Problem Relation Age of Onset   Hypertension Mother    Diabetes Mother    Cancer Father        lung cancer   Cancer Maternal Grandfather 2       prostate cancer   Breast cancer Other        niece (dx age 59)   Social History   Socioeconomic History   Marital status: Married    Spouse name: Not on file   Number of children: 2   Years of education: Not on file   Highest education level: Not on file  Occupational History   Occupation: Insurance claims handler: [employer]  Tobacco Use   Smoking status: Never   Smokeless tobacco: Never  Substance and Sexual Activity   Alcohol  use: No    Alcohol/week: 0.0 standard drinks   Drug use: No   Sexual activity: Yes    Birth control/protection: Surgical    Comment: hyst  Other Topics Concern   Not on file  Social History Narrative   Not on file   Social Determinants of Health   Financial Resource Strain: Not on file  Food Insecurity: Not on file  Transportation Needs: Not on file  Physical Activity: Not on file  Stress: Not on file  Social Connections: Not on file     Review of Systems  Constitutional:  Negative for appetite change and unexpected weight change.  HENT:  Negative for congestion and sinus pressure.   Respiratory:  Negative for cough, chest tightness and shortness of breath.   Cardiovascular:  Negative for chest pain, palpitations and leg swelling.  Gastrointestinal:  Negative for abdominal pain, diarrhea, nausea and vomiting.  Genitourinary:  Negative for difficulty urinating and dysuria.  Musculoskeletal:  Negative for joint swelling and myalgias.  Skin:  Negative for color change and rash.  Neurological:  Negative for dizziness, light-headedness and headaches.  Psychiatric/Behavioral:  Negative for agitation and dysphoric mood.  Objective:     BP 122/76   Pulse 88   Temp 98 F (36.7 C)   Resp 16   Ht 5\' 2"  (1.575 m)   Wt 169 lb 3.2 oz (76.7 kg)   SpO2 98%   BMI 30.95 kg/m  Wt Readings from Last 3 Encounters:  01/12/21 169 lb 3.2 oz (76.7 kg)  08/24/20 167 lb (75.8 kg)  06/01/19 167 lb (75.8 kg)    Physical Exam Vitals reviewed.  Constitutional:      General: She is not in acute distress.    Appearance: Normal appearance.  HENT:     Head: Normocephalic and atraumatic.     Right Ear: External ear normal.     Left Ear: External ear normal.  Eyes:     General: No scleral icterus.       Right eye: No discharge.        Left eye: No discharge.     Conjunctiva/sclera: Conjunctivae normal.  Neck:     Thyroid: No thyromegaly.  Cardiovascular:     Rate and Rhythm:  Normal rate and regular rhythm.  Pulmonary:     Effort: No respiratory distress.     Breath sounds: Normal breath sounds. No wheezing.  Abdominal:     General: Bowel sounds are normal.     Palpations: Abdomen is soft.     Tenderness: There is no abdominal tenderness.  Musculoskeletal:        General: No swelling or tenderness.     Cervical back: Neck supple. No tenderness.  Lymphadenopathy:     Cervical: No cervical adenopathy.  Skin:    Findings: No erythema or rash.  Neurological:     Mental Status: She is alert.  Psychiatric:        Mood and Affect: Mood normal.        Behavior: Behavior normal.     Outpatient Encounter Medications as of 01/12/2021  Medication Sig   levocetirizine (XYZAL) 5 MG tablet Take 5 mg by mouth every evening.   meloxicam (MOBIC) 15 MG tablet Take 1 tablet (15 mg total) by mouth daily.   methylPREDNISolone (MEDROL DOSEPAK) 4 MG TBPK tablet 6 day dose pack - take as directed   No facility-administered encounter medications on file as of 01/12/2021.     Lab Results  Component Value Date   WBC 5.9 01/12/2021   HGB 14.0 01/12/2021   HCT 42.7 01/12/2021   PLT 204.0 01/12/2021   GLUCOSE 83 01/12/2021   CHOL 182 01/12/2021   TRIG 83.0 01/12/2021   HDL 69.80 01/12/2021   LDLCALC 96 01/12/2021   ALT 14 01/12/2021   AST 24 01/12/2021   NA 137 01/12/2021   K 3.5 01/12/2021   CL 102 01/12/2021   CREATININE 0.56 01/12/2021   BUN 10 01/12/2021   CO2 30 01/12/2021   TSH 2.54 01/12/2021   INR 1.0 01/12/2021   HGBA1C 5.5 04/20/2020       Assessment & Plan:   Problem List Items Addressed This Visit     Colon cancer screening    She will call and schedule f/u with Dr Glendora Score.        Elevated alkaline phosphatase level - Primary    Has seen liver specialist.  Recheck liver panel and pt/inr.        Relevant Orders   Lipid panel (Completed)   Hepatic function panel (Completed)   Basic metabolic panel (Completed)   Protime-INR (Completed)    History of cervical cancer  S/p TVH.  Followed by gyn.       Sarcoidosis    Dormant sarcoid.  Followed by Dr Raul Del.  Stable.  Follow.       Relevant Orders   CBC with Differential/Platelet (Completed)   TSH (Completed)   Vitamin D deficiency    Follow vitamin  D level.       Relevant Orders   VITAMIN D 25 Hydroxy (Vit-D Deficiency, Fractures) (Completed)     Einar Pheasant, MD

## 2021-01-12 NOTE — Progress Notes (Signed)
Order placed for f/u stat potassium.  

## 2021-01-21 ENCOUNTER — Encounter: Payer: Self-pay | Admitting: Internal Medicine

## 2021-01-21 NOTE — Assessment & Plan Note (Signed)
S/p TVH.  Followed by gyn.

## 2021-01-21 NOTE — Assessment & Plan Note (Addendum)
Dormant sarcoid.  Followed by Dr Raul Del.  Stable.  Follow.

## 2021-01-21 NOTE — Assessment & Plan Note (Signed)
Follow vitamin D level.  

## 2021-01-21 NOTE — Assessment & Plan Note (Signed)
She will call and schedule f/u with Dr Glendora Score.

## 2021-01-21 NOTE — Assessment & Plan Note (Signed)
Has seen liver specialist.  Recheck liver panel and pt/inr.

## 2021-02-06 LAB — HM COLONOSCOPY

## 2021-03-26 ENCOUNTER — Encounter: Payer: Self-pay | Admitting: Internal Medicine

## 2021-03-28 ENCOUNTER — Other Ambulatory Visit: Payer: Self-pay

## 2021-03-28 ENCOUNTER — Encounter: Payer: Self-pay | Admitting: Family Medicine

## 2021-03-28 ENCOUNTER — Ambulatory Visit (INDEPENDENT_AMBULATORY_CARE_PROVIDER_SITE_OTHER): Payer: PRIVATE HEALTH INSURANCE | Admitting: Family Medicine

## 2021-03-28 DIAGNOSIS — L270 Generalized skin eruption due to drugs and medicaments taken internally: Secondary | ICD-10-CM | POA: Insufficient documentation

## 2021-03-28 MED ORDER — TRIAMCINOLONE ACETONIDE 0.1 % EX CREA: 1.0000 "application " | TOPICAL_CREAM | Freq: Two times a day (BID) | CUTANEOUS | 0 refills | Status: DC

## 2021-03-28 NOTE — Assessment & Plan Note (Addendum)
I suspect this is an allergic reaction to the NyQuil.  This has been progressively improving so I do not believe she needs any oral steroids.  We will give her topical triamcinolone to try on a small area to see if this is helpful initially.  If it is helpful she can try this on the area over her chest.  Advised not to use this in her armpits given risk of atrophy of skin.  If she has any worsening symptoms or if it does not progressively improve over the next week she will let us know.  NyQuil added to her allergy list.  Discussed that she could try Zyrtec or Claritin to see if that would help with her symptoms.

## 2021-03-28 NOTE — Progress Notes (Signed)
°  Tommi Rumps, MD Phone: [phone]  Diane French is a 60 y.o. female who presents today for same-day visit.  Rash: Patient notes this started on 03/24/2021.  She was at an event and sat under a vent on the 23rd and had a be sniffles so she took some NyQuil.  She had no symptoms the next day but she woke up with a rash on her chest that was going down into her axillary area.  She used hydrocortisone and that seemed to aggravate it.  She has been using some aloe and that has been beneficial.  She is also taking Benadryl.  She notes there is itchiness though no pain.  It is significantly better than it was.  She had no mouth or throat involvement or eye involvement.  No trouble swallowing, breathing, or swelling of her tongue.  Social History   Tobacco Use  Smoking Status Never  Smokeless Tobacco Never    No current outpatient medications on file prior to visit.   No current facility-administered medications on file prior to visit.     ROS see history of present illness  Objective  Physical Exam Vitals:   03/28/21 1117  BP: 110/80  Pulse: 96  Temp: 98.1 F (36.7 C)  SpO2: 98%    BP Readings from Last 3 Encounters:  03/28/21 110/80  01/12/21 122/76  08/24/20 132/80   Wt Readings from Last 3 Encounters:  03/28/21 164 lb 12.8 oz (74.8 kg)  01/12/21 169 lb 3.2 oz (76.7 kg)  08/24/20 167 lb (75.8 kg)    Physical Exam Skin:    Comments: Erythematous papular rash over her upper chest, no rash on her back, similar appearing rash in the area on her sides bilaterally inferior to her axilla     Assessment/Plan: Please see individual problem list.  Problem List Items Addressed This Visit     Drug rash    I suspect this is an allergic reaction to the NyQuil.  This has been progressively improving so I do not believe she needs any oral steroids.  We will give her topical triamcinolone to try on a small area to see if this is helpful initially.  If it is helpful she can  try this on the area over her chest.  Advised not to use this in her armpits given risk of atrophy of skin.  If she has any worsening symptoms or if it does not progressively improve over the next week she will let us know.  NyQuil added to her allergy list.  Discussed that she could try Zyrtec or Claritin to see if that would help with her symptoms.      Relevant Medications   triamcinolone cream (KENALOG) 0.1 %    Return if symptoms worsen or fail to improve.  This visit occurred during the SARS-CoV-2 public health emergency.  Safety protocols were in place, including screening questions prior to the visit, additional usage of staff PPE, and extensive cleaning of exam room while observing appropriate contact time as indicated for disinfecting solutions.    Tommi Rumps, MD [place]

## 2021-03-28 NOTE — Patient Instructions (Signed)
Nice to see you. You can try the triamcinolone to see if that will help.  Please try this in the small area to start with.  If it does not irritate things you can try it over your chest.  Please do not use this in your armpits. You can also try Claritin or Zyrtec to help with your symptoms as well. If your symptoms are not improving or if they worsen please let us know.

## 2021-05-18 ENCOUNTER — Other Ambulatory Visit: Payer: Self-pay

## 2021-05-18 ENCOUNTER — Ambulatory Visit (INDEPENDENT_AMBULATORY_CARE_PROVIDER_SITE_OTHER): Payer: PRIVATE HEALTH INSURANCE | Admitting: Internal Medicine

## 2021-05-18 VITALS — BP 128/70 | HR 92 | Temp 97.9°F | Resp 16 | Ht 62.5 in | Wt 164.6 lb

## 2021-05-18 DIAGNOSIS — Z8541 Personal history of malignant neoplasm of cervix uteri: Secondary | ICD-10-CM

## 2021-05-18 DIAGNOSIS — R748 Abnormal levels of other serum enzymes: Secondary | ICD-10-CM

## 2021-05-18 DIAGNOSIS — Z9109 Other allergy status, other than to drugs and biological substances: Secondary | ICD-10-CM | POA: Diagnosis not present

## 2021-05-18 DIAGNOSIS — Z1211 Encounter for screening for malignant neoplasm of colon: Secondary | ICD-10-CM

## 2021-05-18 DIAGNOSIS — D869 Sarcoidosis, unspecified: Secondary | ICD-10-CM | POA: Diagnosis not present

## 2021-05-18 LAB — LIPID PANEL
Cholesterol: 175 mg/dL (ref 0–200)
HDL: 70.2 mg/dL (ref >39.00)
LDL Cholesterol: 93 mg/dL (ref 0–99)
NonHDL: 104.94
Total CHOL/HDL Ratio: 2
Triglycerides: 58 mg/dL (ref 0.0–149.0)
VLDL: 11.6 mg/dL (ref 0.0–40.0)

## 2021-05-18 LAB — PROTIME-INR
INR: 0.9 ratio (ref 0.8–1.0)
Prothrombin Time: 10.2 s (ref 9.6–13.1)

## 2021-05-18 LAB — HEPATIC FUNCTION PANEL
ALT: 20 U/L (ref 0–35)
AST: 23 U/L (ref 0–37)
Albumin: 4.2 g/dL (ref 3.5–5.2)
Alkaline Phosphatase: 100 U/L (ref 39–117)
Bilirubin, Direct: 0.2 mg/dL (ref 0.0–0.3)
Total Bilirubin: 1.1 mg/dL (ref 0.2–1.2)
Total Protein: 6.8 g/dL (ref 6.0–8.3)

## 2021-05-18 NOTE — Progress Notes (Signed)
Patient ID: Diane French, female   DOB: 02-22-1961, 61 y.o.   MRN: [id number]   Subjective:    Patient ID: Diane French, female    DOB: 1960/10/09, 61 y.o.   MRN: [id number]  This visit occurred during the SARS-CoV-2 public health emergency.  Safety protocols were in place, including screening questions prior to the visit, additional usage of staff PPE, and extensive cleaning of exam room while observing appropriate contact time as indicated for disinfecting solutions.   Patient here for a scheduled follow up.  Marland Kitchen   HPI Here to follow up. History of abnormal liver tests.  Doing well.  Feels good.  Stays active.  No chest pain or sob reported.  No abdominal pain or bowel change reported.  No acid reflux. Recently evaluated - rash.  Felt to be allergic reaction to nyquil.  Resolved.  No residual problems.     Past Medical History:  Diagnosis Date   Abnormal Pap smear    HSIL   Cancer ([place]) 2011   cervical cancer   Endometriosis    Environmental allergies    H/O hemorrhoids    Hx of dysmenorrhea    Sarcoidosis    Past Surgical History:  Procedure Laterality Date   ABDOMINAL HYSTERECTOMY  2011   partial, ovaries left in place   CYSTECTOMY     parotid   DILATION AND CURETTAGE OF UTERUS  1992   Miscarriage    LAPAROSCOPY     Family History  Problem Relation Age of Onset   Hypertension Mother    Diabetes Mother    Cancer Father        lung cancer   Cancer Maternal Grandfather 26       prostate cancer   Breast cancer Other        niece (dx age 19)   Social History   Socioeconomic History   Marital status: Married    Spouse name: Not on file   Number of children: 2   Years of education: Not on file   Highest education level: Not on file  Occupational History   Occupation: Insurance claims handler: [employer]  Tobacco Use   Smoking status: Never   Smokeless tobacco: Never  Substance and Sexual Activity   Alcohol use: No    Alcohol/week: 0.0 standard drinks    Drug use: No   Sexual activity: Yes    Birth control/protection: Surgical    Comment: hyst  Other Topics Concern   Not on file  Social History Narrative   Not on file   Social Determinants of Health   Financial Resource Strain: Not on file  Food Insecurity: Not on file  Transportation Needs: Not on file  Physical Activity: Not on file  Stress: Not on file  Social Connections: Not on file     Review of Systems  Constitutional:  Negative for appetite change and unexpected weight change.  HENT:  Negative for sinus pressure.   Respiratory:  Negative for cough, chest tightness and shortness of breath.   Cardiovascular:  Negative for chest pain, palpitations and leg swelling.  Gastrointestinal:  Negative for abdominal pain, diarrhea, nausea and vomiting.  Genitourinary:  Negative for difficulty urinating and dysuria.  Musculoskeletal:  Negative for joint swelling and myalgias.  Skin:  Negative for color change and rash.  Neurological:  Negative for dizziness, light-headedness and headaches.  Psychiatric/Behavioral:  Negative for agitation and dysphoric mood.       Objective:  BP 128/70    Pulse 92    Temp 97.9 F (36.6 C)    Resp 16    Ht 5' 2.5" (1.588 m)    Wt 164 lb 9.6 oz (74.7 kg)    SpO2 97%    BMI 29.63 kg/m  Wt Readings from Last 3 Encounters:  05/18/21 164 lb 9.6 oz (74.7 kg)  03/28/21 164 lb 12.8 oz (74.8 kg)  01/12/21 169 lb 3.2 oz (76.7 kg)    Physical Exam Vitals reviewed.  Constitutional:      General: She is not in acute distress.    Appearance: Normal appearance.  HENT:     Head: Normocephalic and atraumatic.     Right Ear: External ear normal.     Left Ear: External ear normal.  Eyes:     General: No scleral icterus.       Right eye: No discharge.        Left eye: No discharge.     Conjunctiva/sclera: Conjunctivae normal.  Neck:     Thyroid: No thyromegaly.  Cardiovascular:     Rate and Rhythm: Normal rate and regular rhythm.  Pulmonary:      Effort: No respiratory distress.     Breath sounds: Normal breath sounds. No wheezing.  Abdominal:     General: Bowel sounds are normal.     Palpations: Abdomen is soft.     Tenderness: There is no abdominal tenderness.  Musculoskeletal:        General: No swelling or tenderness.     Cervical back: Neck supple. No tenderness.  Lymphadenopathy:     Cervical: No cervical adenopathy.  Skin:    Findings: No erythema or rash.  Neurological:     Mental Status: She is alert.  Psychiatric:        Mood and Affect: Mood normal.        Behavior: Behavior normal.     Outpatient Encounter Medications as of 05/18/2021  Medication Sig   triamcinolone cream (KENALOG) 0.1 % Apply 1 application topically 2 (two) times daily.   No facility-administered encounter medications on file as of 05/18/2021.     Lab Results  Component Value Date   WBC 5.9 01/12/2021   HGB 14.0 01/12/2021   HCT 42.7 01/12/2021   PLT 204.0 01/12/2021   GLUCOSE 83 01/12/2021   CHOL 175 05/18/2021   TRIG 58.0 05/18/2021   HDL 70.20 05/18/2021   LDLCALC 93 05/18/2021   ALT 20 05/18/2021   AST 23 05/18/2021   NA 137 01/12/2021   K 3.5 01/12/2021   CL 102 01/12/2021   CREATININE 0.56 01/12/2021   BUN 10 01/12/2021   CO2 30 01/12/2021   TSH 2.54 01/12/2021   INR 0.9 05/18/2021   HGBA1C 5.5 04/20/2020       Assessment & Plan:   Problem List Items Addressed This Visit     Colon cancer screening    Just had colonoscopy - Dr Glendora Score.  Obtain results.        Elevated alkaline phosphatase level - Primary    Has seen liver specialist.  Recheck liver panel and pt/inr.        Relevant Orders   Lipid panel (Completed)   Hepatic function panel (Completed)   Protime-INR (Completed)   Environmental allergies    No problems reported.        History of cervical cancer    S/p TVH.  Followed by gyn.       Sarcoidosis  Dormant sarcoid.  Followed by Dr Raul Del.  Stable.  Follow.         Einar Pheasant, MD

## 2021-05-19 ENCOUNTER — Encounter: Payer: Self-pay | Admitting: Internal Medicine

## 2021-05-19 NOTE — Assessment & Plan Note (Signed)
Dormant sarcoid.  Followed by Dr Raul Del.  Stable.  Follow.

## 2021-05-19 NOTE — Assessment & Plan Note (Signed)
Just had colonoscopy - Dr Glendora Score.  Obtain results.

## 2021-05-19 NOTE — Assessment & Plan Note (Signed)
S/p TVH.  Followed by gyn.

## 2021-05-19 NOTE — Assessment & Plan Note (Signed)
No problems reported.

## 2021-05-19 NOTE — Assessment & Plan Note (Signed)
Has seen liver specialist.  Recheck liver panel and pt/inr.

## 2021-06-29 LAB — HM MAMMOGRAPHY

## 2021-07-04 ENCOUNTER — Encounter: Payer: Self-pay | Admitting: Internal Medicine

## 2021-11-16 ENCOUNTER — Ambulatory Visit: Payer: PRIVATE HEALTH INSURANCE | Admitting: Internal Medicine

## 2022-07-03 ENCOUNTER — Telehealth: Payer: Self-pay

## 2022-07-03 NOTE — Telephone Encounter (Signed)
Left voicemail for patient asking her to please call us back.  When patient calls back, we need to schedule a follow-up visit with Dr. Einar Pheasant.

## 2022-08-09 ENCOUNTER — Encounter: Payer: Self-pay | Admitting: Internal Medicine

## 2022-08-09 ENCOUNTER — Ambulatory Visit: Payer: PRIVATE HEALTH INSURANCE | Admitting: Internal Medicine

## 2022-08-09 VITALS — BP 122/82 | HR 78 | Temp 97.4°F | Ht 62.5 in | Wt 168.0 lb

## 2022-08-09 DIAGNOSIS — Z9109 Other allergy status, other than to drugs and biological substances: Secondary | ICD-10-CM

## 2022-08-09 DIAGNOSIS — Z13220 Encounter for screening for lipoid disorders: Secondary | ICD-10-CM | POA: Diagnosis not present

## 2022-08-09 DIAGNOSIS — R748 Abnormal levels of other serum enzymes: Secondary | ICD-10-CM

## 2022-08-09 DIAGNOSIS — Z8742 Personal history of other diseases of the female genital tract: Secondary | ICD-10-CM

## 2022-08-09 DIAGNOSIS — H00012 Hordeolum externum right lower eyelid: Secondary | ICD-10-CM

## 2022-08-09 DIAGNOSIS — Z1211 Encounter for screening for malignant neoplasm of colon: Secondary | ICD-10-CM

## 2022-08-09 DIAGNOSIS — D869 Sarcoidosis, unspecified: Secondary | ICD-10-CM

## 2022-08-09 DIAGNOSIS — Z8541 Personal history of malignant neoplasm of cervix uteri: Secondary | ICD-10-CM

## 2022-08-09 LAB — CBC WITH DIFFERENTIAL/PLATELET
Basophils Absolute: 0.1 10*3/uL (ref 0.0–0.1)
Basophils Relative: 1.1 % (ref 0.0–3.0)
Eosinophils Absolute: 0.2 10*3/uL (ref 0.0–0.7)
Eosinophils Relative: 3.6 % (ref 0.0–5.0)
HCT: 42.8 % (ref 36.0–46.0)
Hemoglobin: 14.2 g/dL (ref 12.0–15.0)
Lymphocytes Relative: 21.5 % (ref 12.0–46.0)
Lymphs Abs: 1 10*3/uL (ref 0.7–4.0)
MCHC: 33.2 g/dL (ref 30.0–36.0)
MCV: 90.3 fl (ref 78.0–100.0)
Monocytes Absolute: 0.5 10*3/uL (ref 0.1–1.0)
Monocytes Relative: 9.8 % (ref 3.0–12.0)
Neutro Abs: 3 10*3/uL (ref 1.4–7.7)
Neutrophils Relative %: 64 % (ref 43.0–77.0)
Platelets: 226 10*3/uL (ref 150.0–400.0)
RBC: 4.74 Mil/uL (ref 3.87–5.11)
RDW: 13.5 % (ref 11.5–15.5)
WBC: 4.7 10*3/uL (ref 4.0–10.5)

## 2022-08-09 LAB — LIPID PANEL
Cholesterol: 171 mg/dL (ref 0–200)
HDL: 63.3 mg/dL (ref >39.00)
LDL Cholesterol: 93 mg/dL (ref 0–99)
NonHDL: 107.99
Total CHOL/HDL Ratio: 3
Triglycerides: 76 mg/dL (ref 0.0–149.0)
VLDL: 15.2 mg/dL (ref 0.0–40.0)

## 2022-08-09 LAB — BASIC METABOLIC PANEL
BUN: 10 mg/dL (ref 6–23)
CO2: 28 mEq/L (ref 19–32)
Calcium: 9.2 mg/dL (ref 8.4–10.5)
Chloride: 103 mEq/L (ref 96–112)
Creatinine, Ser: 0.66 mg/dL (ref 0.40–1.20)
GFR: 94.73 mL/min (ref >60.00)
Glucose, Bld: 87 mg/dL (ref 70–99)
Potassium: 4.4 mEq/L (ref 3.5–5.1)
Sodium: 138 mEq/L (ref 135–145)

## 2022-08-09 LAB — TSH: TSH: 1.9 u[IU]/mL (ref 0.35–5.50)

## 2022-08-09 NOTE — Progress Notes (Unsigned)
Subjective:    Patient ID: Diane French, female    DOB: 25-Apr-1960, 62 y.o.   MRN: [id number]  Patient here for  Chief Complaint  Patient presents with   Medical Management of Chronic Issues    HPI Here to follow up. History of abnormal liver tests. Just evaluated - Dr Huston Foley 07/01/22 - liver enzymes normal.  Recommended recheck liver panel q 4-6 months.  Stays active.  No chest pain or sob reported.  No cough or congestion.  Has seen Dr Melanie Crazier - diagnosed with sty.  Evaluated two weeks ago and reevaluated this am.  Given abx eye drops.  We discussed warm compresses.  Follow.  Call with update.  No abdominal pain or bowel change reported.  Did have colonoscopy recently - Dr Renaldo Reel.  Need to obtain results.  Due mammogram. She gets through [facility].  She will schedule.  Controlling allergy symptoms - wears mask when outdoors.     Past Medical History:  Diagnosis Date   Abnormal Pap smear    HSIL   Cancer ([facility]) 2011   cervical cancer   Endometriosis    Environmental allergies    H/O hemorrhoids    Hx of dysmenorrhea    Sarcoidosis    Past Surgical History:  Procedure Laterality Date   ABDOMINAL HYSTERECTOMY  2011   partial, ovaries left in place   CYSTECTOMY     parotid   DILATION AND CURETTAGE OF UTERUS  1992   Miscarriage    LAPAROSCOPY     Family History  Problem Relation Age of Onset   Hypertension Mother    Diabetes Mother    Cancer Father        lung cancer   Cancer Maternal Grandfather 74       prostate cancer   Breast cancer Other        niece (dx age 8)   Social History   Socioeconomic History   Marital status: Married    Spouse name: Not on file   Number of children: 2   Years of education: Not on file   Highest education level: Not on file  Occupational History   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  Tobacco Use   Smoking status: Never   Smokeless tobacco: Never  Substance and Sexual Activity   Alcohol use: No     Alcohol/week: 0.0 standard drinks of alcohol   Drug use: No   Sexual activity: Yes    Birth control/protection: Surgical    Comment: hyst  Other Topics Concern   Not on file  Social History Narrative   Not on file   Social Determinants of Health   Financial Resource Strain: Not on file  Food Insecurity: Not on file  Transportation Needs: Not on file  Physical Activity: Not on file  Stress: Not on file  Social Connections: Not on file     Review of Systems  Constitutional:  Negative for appetite change and unexpected weight change.  HENT:  Negative for congestion and sinus pressure.   Respiratory:  Negative for cough, chest tightness and shortness of breath.   Cardiovascular:  Negative for chest pain and palpitations.  Gastrointestinal:  Negative for abdominal pain, diarrhea, nausea and vomiting.  Genitourinary:  Negative for difficulty urinating and dysuria.  Musculoskeletal:  Negative for joint swelling and myalgias.  Skin:  Negative for color change and rash.  Neurological:  Negative for dizziness and headaches.  Psychiatric/Behavioral:  Negative for agitation and  dysphoric mood.        Objective:     BP 122/82   Pulse 78   Temp (!) 97.4 F (36.3 C)   Ht 5' 2.5" (1.588 m)   Wt 168 lb (76.2 kg)   SpO2 97%   BMI 30.24 kg/m  Wt Readings from Last 3 Encounters:  08/09/22 168 lb (76.2 kg)  05/18/21 164 lb 9.6 oz (74.7 kg)  03/28/21 164 lb 12.8 oz (74.8 kg)    Physical Exam Vitals reviewed.  Constitutional:      General: She is not in acute distress.    Appearance: Normal appearance.  HENT:     Head: Normocephalic and atraumatic.     Right Ear: External ear normal.     Left Ear: External ear normal.  Eyes:     General: No scleral icterus.       Right eye: No discharge.        Left eye: No discharge.     Conjunctiva/sclera: Conjunctivae normal.  Neck:     Thyroid: No thyromegaly.  Cardiovascular:     Rate and Rhythm: Normal rate and regular rhythm.   Pulmonary:     Effort: No respiratory distress.     Breath sounds: Normal breath sounds. No wheezing.  Abdominal:     General: Bowel sounds are normal.     Palpations: Abdomen is soft.     Tenderness: There is no abdominal tenderness.  Musculoskeletal:        General: No swelling or tenderness.     Cervical back: Neck supple. No tenderness.  Lymphadenopathy:     Cervical: No cervical adenopathy.  Skin:    Findings: No erythema or rash.  Neurological:     Mental Status: She is alert.  Psychiatric:        Mood and Affect: Mood normal.        Behavior: Behavior normal.      Outpatient Encounter Medications as of 08/09/2022  Medication Sig   cholecalciferol (VITAMIN D3) 25 MCG (1000 UNIT) tablet Take 1,000 Units by mouth daily.   diphenhydrAMINE HCl (BENADRYL ALLERGY PO) Benadryl   neomycin-polymyxin-dexamethasone (MAXITROL) 0.1 % ophthalmic suspension 1 drop 3 (three) times daily.   [DISCONTINUED] triamcinolone cream (KENALOG) 0.1 % Apply 1 application topically 2 (two) times daily. (Patient not taking: Reported on 08/09/2022)   No facility-administered encounter medications on file as of 08/09/2022.     Lab Results  Component Value Date   WBC 4.7 08/09/2022   HGB 14.2 08/09/2022   HCT 42.8 08/09/2022   PLT 226.0 08/09/2022   GLUCOSE 87 08/09/2022   CHOL 171 08/09/2022   TRIG 76.0 08/09/2022   HDL 63.30 08/09/2022   LDLCALC 93 08/09/2022   ALT 20 05/18/2021   AST 23 05/18/2021   NA 138 08/09/2022   K 4.4 08/09/2022   CL 103 08/09/2022   CREATININE 0.66 08/09/2022   BUN 10 08/09/2022   CO2 28 08/09/2022   TSH 1.90 08/09/2022   INR 0.9 05/18/2021   HGBA1C 5.5 04/20/2020    No results found.     Assessment & Plan:  Screening cholesterol level -     Lipid panel  Sarcoidosis Assessment & Plan: Dormant sarcoid.  Followed by Dr Meredeth Ide.  Discussed f/u with pulmonary.  Has been stable.  Follow.   Orders: -     CBC with Differential/Platelet -     Basic  metabolic panel -     TSH  Elevated alkaline phosphatase level Assessment &  Plan: 07/02/22 - Dr Cornelia Copa - liver panel wnl.  F/u prn.  Follow liver function tests q 4-6 months.    History of abnormal cervical Pap smear Assessment & Plan: Sees gyn per report.  Obtain records.     Colon cancer screening Assessment & Plan: Just had colonoscopy - Dr Evelena Peat.  Obtain results.     Environmental allergies Assessment & Plan: Controls with wearing maks outside and limits time outside.  Follow.    History of cervical cancer Assessment & Plan: S/p TVH.  Followed by gyn.    Hordeolum externum of right lower eyelid Assessment & Plan: Saw ophthalmology.  Given abx drops.  Discussed warm compresses.  Follow.       Dale Alburnett, MD

## 2022-08-10 ENCOUNTER — Encounter: Payer: Self-pay | Admitting: Internal Medicine

## 2022-08-10 DIAGNOSIS — H00019 Hordeolum externum unspecified eye, unspecified eyelid: Secondary | ICD-10-CM | POA: Insufficient documentation

## 2022-08-10 NOTE — Assessment & Plan Note (Signed)
Saw ophthalmology.  Given abx drops.  Discussed warm compresses.  Follow.

## 2022-08-10 NOTE — Assessment & Plan Note (Signed)
Dormant sarcoid.  Followed by Dr Meredeth Ide.  Discussed f/u with pulmonary.  Has been stable.  Follow.

## 2022-08-10 NOTE — Assessment & Plan Note (Signed)
Just had colonoscopy - Dr Hueng.  Obtain results.   °

## 2022-08-10 NOTE — Assessment & Plan Note (Signed)
07/02/22 - Dr Cornelia Copa - liver panel wnl.  F/u prn.  Follow liver function tests q 4-6 months.

## 2022-08-10 NOTE — Assessment & Plan Note (Signed)
S/p TVH.  Followed by gyn.  

## 2022-08-10 NOTE — Assessment & Plan Note (Signed)
Controls with wearing maks outside and limits time outside.  Follow.

## 2022-08-10 NOTE — Assessment & Plan Note (Signed)
Sees gyn per report.  Obtain records.

## 2022-08-12 ENCOUNTER — Other Ambulatory Visit: Payer: Self-pay

## 2022-08-12 ENCOUNTER — Telehealth: Payer: Self-pay

## 2022-08-12 ENCOUNTER — Ambulatory Visit: Payer: Self-pay

## 2022-08-12 NOTE — Telephone Encounter (Signed)
FYI colonoscopy requested but patient has not seen gynecology recently.

## 2022-08-12 NOTE — Telephone Encounter (Signed)
Pt called back and I read the message to her and she stated she has not had the appointment with the gynecologist yet

## 2022-08-12 NOTE — Telephone Encounter (Signed)
-----   Message from Dale Maupin, MD sent at 08/10/2022  9:09 AM EDT ----- Notify - cholesterol levels are ok.  Hgb, thyroid test and kidney function tests are wnl.  I had discussed with Lanora Manis at [facility] appt - needed copy of colonoscopy (Dr Renaldo Reel).  She had her sign form.  Also need last gyn office note and pap smear result.

## 2022-08-12 NOTE — Telephone Encounter (Signed)
Noted. Tell her to let us know if we need to get gyn appt scheduled.

## 2022-08-12 NOTE — Telephone Encounter (Signed)
LMTCB. Ok to give results. Colonoscopy has been requested. Need to know which gynecologist she sees so I can request those records.

## 2022-08-13 NOTE — Telephone Encounter (Signed)
Called patient to clarify which gyn she has saw in the past. Patient says she has been seen since 2013 but due again for pap. She will schedule with [facility] [place]. Requested most recent records from them.

## 2023-01-31 ENCOUNTER — Encounter: Payer: Self-pay | Admitting: Internal Medicine

## 2023-01-31 DIAGNOSIS — M79646 Pain in unspecified finger(s): Secondary | ICD-10-CM | POA: Insufficient documentation

## 2023-02-14 ENCOUNTER — Ambulatory Visit: Payer: PRIVATE HEALTH INSURANCE | Admitting: Internal Medicine

## 2023-02-14 VITALS — BP 126/72 | HR 75 | Temp 98.0°F | Resp 16 | Ht 62.5 in | Wt 170.6 lb

## 2023-02-14 DIAGNOSIS — D869 Sarcoidosis, unspecified: Secondary | ICD-10-CM | POA: Diagnosis not present

## 2023-02-14 DIAGNOSIS — M79645 Pain in left finger(s): Secondary | ICD-10-CM

## 2023-02-14 DIAGNOSIS — R748 Abnormal levels of other serum enzymes: Secondary | ICD-10-CM

## 2023-02-14 DIAGNOSIS — M25571 Pain in right ankle and joints of right foot: Secondary | ICD-10-CM

## 2023-02-14 DIAGNOSIS — Z8541 Personal history of malignant neoplasm of cervix uteri: Secondary | ICD-10-CM

## 2023-02-14 LAB — BASIC METABOLIC PANEL
BUN: 12 mg/dL (ref 6–23)
CO2: 30 meq/L (ref 19–32)
Calcium: 9.4 mg/dL (ref 8.4–10.5)
Chloride: 104 meq/L (ref 96–112)
Creatinine, Ser: 0.74 mg/dL (ref 0.40–1.20)
GFR: 87.06 mL/min (ref >60.00)
Glucose, Bld: 90 mg/dL (ref 70–99)
Potassium: 4.4 meq/L (ref 3.5–5.1)
Sodium: 139 meq/L (ref 135–145)

## 2023-02-14 LAB — HEPATIC FUNCTION PANEL
ALT: 21 U/L (ref 0–35)
AST: 23 U/L (ref 0–37)
Albumin: 4.1 g/dL (ref 3.5–5.2)
Alkaline Phosphatase: 104 U/L (ref 39–117)
Bilirubin, Direct: 0.2 mg/dL (ref 0.0–0.3)
Total Bilirubin: 1.1 mg/dL (ref 0.2–1.2)
Total Protein: 7.2 g/dL (ref 6.0–8.3)

## 2023-02-14 NOTE — Progress Notes (Unsigned)
Subjective:    Patient ID: Diane French, female    DOB: [birth year], 61 y.o.   MRN: [id number]  Patient here for  Chief Complaint  Patient presents with   Medical Management of Chronic Issues    HPI Here to follow up. History of abnormal liver tests. Just evaluated - Dr Huston Foley 07/01/22 - liver enzymes normal.  Recommended recheck liver panel q 4-6 months. Sees gyn.  Last pap 07/2020- negative (atrophy) with negative HPV. She will schedule her f/u mammogram and gyn appt.  She stays active.  Increased pain recently - base of left thumb.  Saw Dr Martha Clan.  Placed on meloxicam. Did not tolerate.  Wearing a brace.  Discussed f/u - question of need for injection. Breathing stable. Plans to f/u with Dr Meredeth Ide for history of sarcoid.  Did injure her right ankle a few months ago.  Is better. Mainly notices some discomfort with heels, otherwise ok.     Past Medical History:  Diagnosis Date   Abnormal Pap smear    HSIL   Cancer ([facility]) 2011   cervical cancer   Endometriosis    Environmental allergies    H/O hemorrhoids    Hx of dysmenorrhea    Sarcoidosis    Past Surgical History:  Procedure Laterality Date   ABDOMINAL HYSTERECTOMY  2011   partial, ovaries left in place   CYSTECTOMY     parotid   DILATION AND CURETTAGE OF UTERUS  1992   Miscarriage    LAPAROSCOPY     Family History  Problem Relation Age of Onset   Hypertension Mother    Diabetes Mother    Cancer Father        lung cancer   Cancer Maternal Grandfather 27       prostate cancer   Breast cancer Other        niece (dx age 59)   Social History   Socioeconomic History   Marital status: Married    Spouse name: Not on file   Number of children: 2   Years of education: Not on file   Highest education level: Not on file  Occupational History   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  Tobacco Use   Smoking status: Never   Smokeless tobacco: Never  Substance and Sexual Activity   Alcohol use: No     Alcohol/week: 0.0 standard drinks of alcohol   Drug use: No   Sexual activity: Yes    Birth control/protection: Surgical    Comment: hyst  Other Topics Concern   Not on file  Social History Narrative   Not on file   Social Determinants of Health   Financial Resource Strain: Not on file  Food Insecurity: Not on file  Transportation Needs: Not on file  Physical Activity: Not on file  Stress: Not on file  Social Connections: Not on file     Review of Systems     Objective:     BP 126/72   Pulse 75   Temp 98 F (36.7 C)   Resp 16   Ht 5' 2.5" (1.588 m)   Wt 170 lb 9.6 oz (77.4 kg)   SpO2 98%   BMI 30.71 kg/m  Wt Readings from Last 3 Encounters:  02/14/23 170 lb 9.6 oz (77.4 kg)  08/09/22 168 lb (76.2 kg)  05/18/21 164 lb 9.6 oz (74.7 kg)    Physical Exam   Outpatient Encounter Medications as of 02/14/2023  Medication Sig  meloxicam (MOBIC) 7.5 MG tablet Take 7.5 mg by mouth 2 (two) times daily with a meal.   cholecalciferol (VITAMIN D3) 25 MCG (1000 UNIT) tablet Take 1,000 Units by mouth daily.   [DISCONTINUED] diphenhydrAMINE HCl (BENADRYL ALLERGY PO) Benadryl   [DISCONTINUED] neomycin-polymyxin-dexamethasone (MAXITROL) 0.1 % ophthalmic suspension 1 drop 3 (three) times daily.   No facility-administered encounter medications on file as of 02/14/2023.     Lab Results  Component Value Date   WBC 4.7 08/09/2022   HGB 14.2 08/09/2022   HCT 42.8 08/09/2022   PLT 226.0 08/09/2022   GLUCOSE 87 08/09/2022   CHOL 171 08/09/2022   TRIG 76.0 08/09/2022   HDL 63.30 08/09/2022   LDLCALC 93 08/09/2022   ALT 20 05/18/2021   AST 23 05/18/2021   NA 138 08/09/2022   K 4.4 08/09/2022   CL 103 08/09/2022   CREATININE 0.66 08/09/2022   BUN 10 08/09/2022   CO2 28 08/09/2022   TSH 1.90 08/09/2022   INR 0.9 05/18/2021   HGBA1C 5.5 04/20/2020    No results found.     Assessment & Plan:  Elevated alkaline phosphatase level -     Basic metabolic panel -      Hepatic function panel     Dale [place], MD

## 2023-02-15 ENCOUNTER — Encounter: Payer: Self-pay | Admitting: Internal Medicine

## 2023-02-15 DIAGNOSIS — M25579 Pain in unspecified ankle and joints of unspecified foot: Secondary | ICD-10-CM | POA: Insufficient documentation

## 2023-02-15 NOTE — Assessment & Plan Note (Signed)
[facility] - 12/2022 - brace, meloxicam. Off meloxicam now. Intolerance.  Discussed. Plans f/u with ortho for further evaluation and question of need for injection.

## 2023-02-15 NOTE — Assessment & Plan Note (Signed)
07/02/22 - Dr Cornelia Copa - liver panel wnl.  F/u prn.  Follow liver function tests q 4-6 months. Recheck liver panel today.

## 2023-02-15 NOTE — Assessment & Plan Note (Signed)
Previous ankle injury.  Pain as improved.  Supports.  Avoid heels.  Follow.  Notify if persistent.

## 2023-02-15 NOTE — Assessment & Plan Note (Signed)
Dormant sarcoid.  Followed by Dr Meredeth Ide.  Overdue. Discussed f/u with pulmonary.  Has been stable.  She will call and schedule.

## 2023-02-15 NOTE — Assessment & Plan Note (Signed)
S/p TVH.  Followed by gyn. Plans to call for f/u.

## 2023-05-06 LAB — HM MAMMOGRAPHY

## 2023-08-15 ENCOUNTER — Ambulatory Visit: Payer: PRIVATE HEALTH INSURANCE | Admitting: Internal Medicine

## 2023-08-15 ENCOUNTER — Encounter: Payer: Self-pay | Admitting: Internal Medicine

## 2023-08-15 VITALS — BP 110/62 | HR 83 | Temp 97.7°F | Ht 62.5 in | Wt 167.8 lb

## 2023-08-15 DIAGNOSIS — Z1211 Encounter for screening for malignant neoplasm of colon: Secondary | ICD-10-CM | POA: Diagnosis not present

## 2023-08-15 DIAGNOSIS — D869 Sarcoidosis, unspecified: Secondary | ICD-10-CM | POA: Diagnosis not present

## 2023-08-15 DIAGNOSIS — R21 Rash and other nonspecific skin eruption: Secondary | ICD-10-CM

## 2023-08-15 DIAGNOSIS — Z13220 Encounter for screening for lipoid disorders: Secondary | ICD-10-CM | POA: Diagnosis not present

## 2023-08-15 DIAGNOSIS — M79645 Pain in left finger(s): Secondary | ICD-10-CM

## 2023-08-15 DIAGNOSIS — R748 Abnormal levels of other serum enzymes: Secondary | ICD-10-CM

## 2023-08-15 LAB — CBC WITH DIFFERENTIAL/PLATELET
Basophils Absolute: 0 10*3/uL (ref 0.0–0.1)
Basophils Relative: 0.8 % (ref 0.0–3.0)
Eosinophils Absolute: 0.2 10*3/uL (ref 0.0–0.7)
Eosinophils Relative: 4.2 % (ref 0.0–5.0)
HCT: 42.7 % (ref 36.0–46.0)
Hemoglobin: 14.2 g/dL (ref 12.0–15.0)
Lymphocytes Relative: 20.4 % (ref 12.0–46.0)
Lymphs Abs: 1.2 10*3/uL (ref 0.7–4.0)
MCHC: 33.3 g/dL (ref 30.0–36.0)
MCV: 89.6 fl (ref 78.0–100.0)
Monocytes Absolute: 0.5 10*3/uL (ref 0.1–1.0)
Monocytes Relative: 9.5 % (ref 3.0–12.0)
Neutro Abs: 3.7 10*3/uL (ref 1.4–7.7)
Neutrophils Relative %: 65.1 % (ref 43.0–77.0)
Platelets: 209 10*3/uL (ref 150.0–400.0)
RBC: 4.77 Mil/uL (ref 3.87–5.11)
RDW: 14.3 % (ref 11.5–15.5)
WBC: 5.7 10*3/uL (ref 4.0–10.5)

## 2023-08-15 LAB — LIPID PANEL
Cholesterol: 180 mg/dL (ref 0–200)
HDL: 63.9 mg/dL (ref >39.00)
LDL Cholesterol: 93 mg/dL (ref 0–99)
NonHDL: 115.85
Total CHOL/HDL Ratio: 3
Triglycerides: 113 mg/dL (ref 0.0–149.0)
VLDL: 22.6 mg/dL (ref 0.0–40.0)

## 2023-08-15 LAB — HEPATIC FUNCTION PANEL
ALT: 21 U/L (ref 0–35)
AST: 23 U/L (ref 0–37)
Albumin: 4.2 g/dL (ref 3.5–5.2)
Alkaline Phosphatase: 106 U/L (ref 39–117)
Bilirubin, Direct: 0.4 mg/dL — ABNORMAL HIGH (ref 0.0–0.3)
Total Bilirubin: 1.1 mg/dL (ref 0.2–1.2)
Total Protein: 7.3 g/dL (ref 6.0–8.3)

## 2023-08-15 LAB — BASIC METABOLIC PANEL WITH GFR
BUN: 8 mg/dL (ref 6–23)
CO2: 30 meq/L (ref 19–32)
Calcium: 9.2 mg/dL (ref 8.4–10.5)
Chloride: 104 meq/L (ref 96–112)
Creatinine, Ser: 0.67 mg/dL (ref 0.40–1.20)
GFR: 93.72 mL/min (ref >60.00)
Glucose, Bld: 100 mg/dL — ABNORMAL HIGH (ref 70–99)
Potassium: 4.2 meq/L (ref 3.5–5.1)
Sodium: 140 meq/L (ref 135–145)

## 2023-08-15 NOTE — Progress Notes (Signed)
 Subjective:    Patient ID: Diane French, female    DOB: 1960/08/20, 63 y.o.   MRN: [id number]  Patient here for  Chief Complaint  Patient presents with   Medical Management of Chronic Issues    6 month f/u    HPI Here for a scheduled follow up - follow up regarding abnormal liver function tests. Sees Dr Mirian Ames for f/u liver function as well. Also sees gyn. Last pap 07/2020 - negative (atrophy) with negative HPV. 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 S/p corticosteroid injection 01/2024. Placed in brace. Recommended hand exercises. Overall doing well. Stays active. No chest pain or sob reported. Discussed f/u with Dr Jamal Mays regarding sarcoid. Does report rash - right lateral neck.    Past Medical History:  Diagnosis Date   Abnormal Pap smear    HSIL   Cancer (HCC) 2011   cervical cancer   Endometriosis    Environmental allergies    H/O hemorrhoids    Hx of dysmenorrhea    Sarcoidosis    Past Surgical History:  Procedure Laterality Date   ABDOMINAL HYSTERECTOMY  2011   partial, ovaries left in place   CYSTECTOMY     parotid   DILATION AND CURETTAGE OF UTERUS  1992   Miscarriage    LAPAROSCOPY     Family History  Problem Relation Age of Onset   Hypertension Mother    Diabetes Mother    Cancer Father        lung cancer   Cancer Maternal Grandfather 72       prostate cancer   Breast cancer Other        niece (dx age 48)   Social History   Socioeconomic History   Marital status: Married    Spouse name: Not on file   Number of children: 2   Years of education: Not on file   Highest education level: Not on file  Occupational History   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  Tobacco Use   Smoking status: Never   Smokeless tobacco: Never  Substance and Sexual Activity   Alcohol use: No    Alcohol/week: 0.0 standard drinks of alcohol   Drug use: No   Sexual activity: Yes    Birth control/protection: Surgical     Comment: hyst  Other Topics Concern   Not on file  Social History Narrative   Not on file   Social Drivers of Health   Financial Resource Strain: Not on file  Food Insecurity: Not on file  Transportation Needs: Not on file  Physical Activity: Not on file  Stress: Not on file  Social Connections: Not on file     Review of Systems  Constitutional:  Negative for appetite change and unexpected weight change.  HENT:  Negative for congestion and sinus pressure.   Respiratory:  Negative for cough, chest tightness and shortness of breath.   Cardiovascular:  Negative for chest pain, palpitations and leg swelling.  Gastrointestinal:  Negative for abdominal pain, diarrhea, nausea and vomiting.  Genitourinary:  Negative for difficulty urinating and dysuria.  Musculoskeletal:  Negative for joint swelling and myalgias.  Skin:  Positive for rash. Negative for color change.  Neurological:  Negative for dizziness and headaches.  Psychiatric/Behavioral:  Negative for agitation and dysphoric mood.        Objective:     BP 110/62   Pulse 83   Temp 97.7 F (36.5 C)  Ht 5' 2.5" (1.588 m)   Wt 167 lb 12.8 oz (76.1 kg)   SpO2 96%   BMI 30.20 kg/m  Wt Readings from Last 3 Encounters:  08/15/23 167 lb 12.8 oz (76.1 kg)  02/14/23 170 lb 9.6 oz (77.4 kg)  08/09/22 168 lb (76.2 kg)    Physical Exam Vitals reviewed.  Constitutional:      General: She is not in acute distress.    Appearance: Normal appearance.  HENT:     Head: Normocephalic and atraumatic.     Right Ear: External ear normal.     Left Ear: External ear normal.     Mouth/Throat:     Pharynx: No oropharyngeal exudate or posterior oropharyngeal erythema.  Eyes:     General: No scleral icterus.       Right eye: No discharge.        Left eye: No discharge.     Conjunctiva/sclera: Conjunctivae normal.  Neck:     Thyroid : No thyromegaly.  Cardiovascular:     Rate and Rhythm: Normal rate and regular rhythm.  Pulmonary:      Effort: No respiratory distress.     Breath sounds: Normal breath sounds. No wheezing.  Abdominal:     General: Bowel sounds are normal.     Palpations: Abdomen is soft.     Tenderness: There is no abdominal tenderness.  Musculoskeletal:        General: No swelling or tenderness.     Cervical back: Neck supple. No tenderness.  Lymphadenopathy:     Cervical: No cervical adenopathy.  Skin:    Findings: No erythema.     Comments: Erythematous based rash - right lateral neck.   Neurological:     Mental Status: She is alert.  Psychiatric:        Mood and Affect: Mood normal.        Behavior: Behavior normal.         Outpatient Encounter Medications as of 08/15/2023  Medication Sig   cholecalciferol (VITAMIN D3) 25 MCG (1000 UNIT) tablet Take 1,000 Units by mouth daily.   diphenhydrAMINE (BENADRYL) 25 mg capsule Take 25 mg by mouth at bedtime as needed for allergies.   triamcinolone  cream (KENALOG ) 0.1 % Apply 1 Application topically 2 (two) times daily.   [DISCONTINUED] meloxicam  (MOBIC ) 7.5 MG tablet Take 7.5 mg by mouth 2 (two) times daily with a meal.   No facility-administered encounter medications on file as of 08/15/2023.     Lab Results  Component Value Date   WBC 5.7 08/15/2023   HGB 14.2 08/15/2023   HCT 42.7 08/15/2023   PLT 209.0 08/15/2023   GLUCOSE 100 (H) 08/15/2023   CHOL 180 08/15/2023   TRIG 113.0 08/15/2023   HDL 63.90 08/15/2023   LDLCALC 93 08/15/2023   ALT 21 08/15/2023   AST 23 08/15/2023   NA 140 08/15/2023   K 4.2 08/15/2023   CL 104 08/15/2023   CREATININE 0.67 08/15/2023   BUN 8 08/15/2023   CO2 30 08/15/2023   TSH 3.67 08/15/2023   INR 0.9 05/18/2021   HGBA1C 5.5 04/20/2020       Assessment & Plan:  Colon cancer screening Assessment & Plan: Colonoscopy 01/2021 - 3mm polyp in the transverse colon, diverticulosis. Recommended f/u colonoscopy in 7 years. Dr Maretta Shaper)   Sarcoidosis Assessment & Plan: Dormant sarcoid.  Followed by  Dr Jamal Mays.  Overdue. Discussed f/u with pulmonary.  Has been stable.  She will call and schedule.  Orders: -     CBC with Differential/Platelet -     Basic metabolic panel with GFR -     TSH -     Pulmonary Visit  Screening cholesterol level -     Lipid panel  Elevated alkaline phosphatase level Assessment & Plan: Has seen Dr Girard Lam  for f/u abnormal liver function tests. Requested us  to follow liver function tests q 4-6 months. Follow liver panel.   Orders: -     Hepatic function panel  Pain of left thumb Assessment & Plan: Has been seeing Dr Alan All - for left thubm [facility] arthrosis. S/p corticosteroid injection 01/2024. Placed in brace. Recommended hand exercises. Stable.    Rash Assessment & Plan: Rash - right lateral neck. Triamcinolone  cream. Follow. Call if persistent.    Other orders -     Triamcinolone  Acetonide; Apply 1 Application topically 2 (two) times daily.  Dispense: 30 g; Refill: 0     Dellar Fenton, MD

## 2023-08-16 MED ORDER — TRIAMCINOLONE ACETONIDE 0.1 % EX CREA: 1.0000 | TOPICAL_CREAM | Freq: Two times a day (BID) | CUTANEOUS | 0 refills | Status: AC

## 2023-08-19 ENCOUNTER — Encounter: Payer: Self-pay | Admitting: Internal Medicine

## 2023-08-19 LAB — TSH: TSH: 3.67 u[IU]/mL (ref 0.35–5.50)

## 2023-08-20 ENCOUNTER — Ambulatory Visit: Payer: Self-pay | Admitting: Internal Medicine

## 2023-08-22 NOTE — Progress Notes (Signed)
 Noted

## 2023-08-25 ENCOUNTER — Encounter: Payer: Self-pay | Admitting: Internal Medicine

## 2023-08-25 NOTE — Assessment & Plan Note (Signed)
 Has seen Dr Girard Lam  for f/u abnormal liver function tests. Requested us  to follow liver function tests q 4-6 months. Follow liver panel.

## 2023-08-25 NOTE — Assessment & Plan Note (Signed)
 Has been seeing Dr Alan All - for left thubm [facility] arthrosis. S/p corticosteroid injection 01/2024. Placed in brace. Recommended hand exercises. Stable.

## 2023-08-25 NOTE — Assessment & Plan Note (Signed)
 Dormant sarcoid.  Followed by Dr Meredeth Ide.  Overdue. Discussed f/u with pulmonary.  Has been stable.  She will call and schedule.

## 2023-08-25 NOTE — Assessment & Plan Note (Signed)
 Colonoscopy 01/2021 - 3mm polyp in the transverse colon, diverticulosis. Recommended f/u colonoscopy in 7 years. Dr Maretta Shaper)

## 2023-08-25 NOTE — Assessment & Plan Note (Signed)
 Rash - right lateral neck. Triamcinolone  cream. Follow. Call if persistent.

## 2023-12-18 ENCOUNTER — Telehealth: Payer: Self-pay | Admitting: Internal Medicine

## 2023-12-18 NOTE — Telephone Encounter (Signed)
 Pt was in today asking about scheduling for RSV and Pneumonia vaccines. She will be back in office on 9/24 for an appt w/Dr Glendia and would like to discuss then. Waiting to also schedule Flu vax when she comes in.

## 2023-12-18 NOTE — Telephone Encounter (Signed)
 Noted

## 2023-12-26 ENCOUNTER — Encounter: Payer: Self-pay | Admitting: Internal Medicine

## 2023-12-26 ENCOUNTER — Ambulatory Visit: Payer: PRIVATE HEALTH INSURANCE | Admitting: Internal Medicine

## 2023-12-26 VITALS — BP 120/84 | HR 82 | Temp 98.0°F | Ht 62.5 in | Wt 167.8 lb

## 2023-12-26 DIAGNOSIS — R748 Abnormal levels of other serum enzymes: Secondary | ICD-10-CM

## 2023-12-26 DIAGNOSIS — R21 Rash and other nonspecific skin eruption: Secondary | ICD-10-CM

## 2023-12-26 DIAGNOSIS — D869 Sarcoidosis, unspecified: Secondary | ICD-10-CM

## 2023-12-26 NOTE — Progress Notes (Addendum)
 Subjective:    Patient ID: Diane French, female    DOB: October 25, 1960, 63 y.o.   MRN: [id number]  Patient here for  Chief Complaint  Patient presents with   Urticaria    Red bumps, itchiness     HPI Here for work in appt - work in with concerns regarding rash/bumps. SABRA Aquas Dr Nola for f/u liver function as well. Also sees gyn. Last pap 07/2020 - negative (atrophy) with negative HPV. 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 S/p corticosteroid injection 01/2024. Placed in brace. Recommended hand exercises. Worked in today due to - rash as outlined. States at the beginning of 12/2023 - went [place] apple [place]. Noticed red bumps on her arm the following day. Continues to have intermittent red bumps. Initial lesions will clear and then new areas appear. No fever. No increased joint aches. Otherwise feeling ok. Has a few places now - one on top of foot, arms. No facial involvement and no increased trunk involvement. No new exposures. No new soaps or detergents. She was questioning if could be related to the apples initially, but not eating them now. No sob.    Past Medical History:  Diagnosis Date   Abnormal Pap smear    HSIL   Cancer (HCC) 2011   cervical cancer   Endometriosis    Environmental allergies    H/O hemorrhoids    Hx of dysmenorrhea    Sarcoidosis    Past Surgical History:  Procedure Laterality Date   ABDOMINAL HYSTERECTOMY  2011   partial, ovaries left in place   CYSTECTOMY     parotid   DILATION AND CURETTAGE OF UTERUS  1992   Miscarriage    LAPAROSCOPY     Family History  Problem Relation Age of Onset   Hypertension Mother    Diabetes Mother    Cancer Father        lung cancer   Cancer Maternal Grandfather 78       prostate cancer   Breast cancer Other        niece (dx age 67)   Social History   Socioeconomic History   Marital status: Married    Spouse name: Not on file   Number of children: 2   Years of education: Not on file    Highest education level: Not on file  Occupational History   Occupation: Insurance Risk Surveyor: [employer]  Tobacco Use   Smoking status: Never   Smokeless tobacco: Never  Substance and Sexual Activity   Alcohol use: No    Alcohol/week: 0.0 standard drinks of alcohol   Drug use: No   Sexual activity: Yes    Birth control/protection: Surgical    Comment: hyst  Other Topics Concern   Not on file  Social History Narrative   Not on file   Social Drivers of Health   Financial Resource Strain: Low Risk  (12/27/2023)   Received from [facility]   Overall Financial Resource Strain (CARDIA)    Difficulty of Paying Living Expenses: Not hard at all  Food Insecurity: No Food Insecurity (12/27/2023)   Received from [facility]   Hunger Vital Sign    Within the past 12 months, you worried that your food would run out before you got the money to buy more.: Never true    Within the past 12 months, the food you bought just didn't last and you didn't have  money to get more.: Never true  Transportation Needs: No Transportation Needs (12/27/2023)   Received from [facility] - Transportation    In the past 12 months, has lack of transportation kept you from medical appointments or from getting medications?: No    Lack of Transportation (Non-Medical): No  Physical Activity: Not on file  Stress: Not on file  Social Connections: Not on file     Review of Systems  Constitutional:  Negative for appetite change and fever.  HENT:  Negative for congestion and sinus pressure.   Respiratory:  Negative for cough, chest tightness and shortness of breath.   Cardiovascular:  Negative for chest pain, palpitations and leg swelling.  Gastrointestinal:  Negative for abdominal pain, diarrhea, nausea and vomiting.  Genitourinary:  Negative for difficulty urinating and dysuria.  Musculoskeletal:  Negative for joint swelling and  myalgias.  Skin:  Positive for rash. Negative for wound.  Neurological:  Negative for dizziness and headaches.  Psychiatric/Behavioral:  Negative for agitation and dysphoric mood.        Objective:     BP 120/84   Pulse 82   Temp 98 F (36.7 C) (Oral)   Ht 5' 2.5 (1.588 m)   Wt 167 lb 12.8 oz (76.1 kg)   SpO2 94%   BMI 30.20 kg/m  Wt Readings from Last 3 Encounters:  12/26/23 167 lb 12.8 oz (76.1 kg)  08/15/23 167 lb 12.8 oz (76.1 kg)  02/14/23 170 lb 9.6 oz (77.4 kg)    Physical Exam Vitals reviewed.  Constitutional:      General: She is not in acute distress.    Appearance: Normal appearance.  HENT:     Head: Normocephalic and atraumatic.     Right Ear: External ear normal.     Left Ear: External ear normal.     Mouth/Throat:     Pharynx: No oropharyngeal exudate or posterior oropharyngeal erythema.  Eyes:     General: No scleral icterus.       Right eye: No discharge.        Left eye: No discharge.     Conjunctiva/sclera: Conjunctivae normal.  Neck:     Thyroid : No thyromegaly.  Cardiovascular:     Rate and Rhythm: Normal rate and regular rhythm.  Pulmonary:     Effort: No respiratory distress.     Breath sounds: Normal breath sounds. No wheezing.  Abdominal:     General: Bowel sounds are normal.     Palpations: Abdomen is soft.     Tenderness: There is no abdominal tenderness.  Musculoskeletal:        General: No swelling or tenderness.     Cervical back: Neck supple. No tenderness.  Lymphadenopathy:     Cervical: No cervical adenopathy.  Skin:    Comments: Few erythematous circular lesions/rash - arms. One on top of foot. No facial involvement. No increased trunk involvement.   Neurological:     Mental Status: She is alert.  Psychiatric:        Mood and Affect: Mood normal.        Behavior: Behavior normal.         Outpatient Encounter Medications as of 12/26/2023  Medication Sig   cholecalciferol (VITAMIN D3) 25 MCG (1000 UNIT) tablet Take  1,000 Units by mouth daily.   diphenhydrAMINE (BENADRYL) 25 mg capsule Take 25 mg by mouth at bedtime as needed for allergies.   triamcinolone  cream (KENALOG ) 0.1 % Apply 1 Application  topically 2 (two) times daily.   No facility-administered encounter medications on file as of 12/26/2023.     Lab Results  Component Value Date   WBC 5.7 08/15/2023   HGB 14.2 08/15/2023   HCT 42.7 08/15/2023   PLT 209.0 08/15/2023   GLUCOSE 100 (H) 08/15/2023   CHOL 180 08/15/2023   TRIG 113.0 08/15/2023   HDL 63.90 08/15/2023   LDLCALC 93 08/15/2023   ALT 21 08/15/2023   AST 23 08/15/2023   NA 140 08/15/2023   K 4.2 08/15/2023   CL 104 08/15/2023   CREATININE 0.67 08/15/2023   BUN 8 08/15/2023   CO2 30 08/15/2023   TSH 3.67 08/15/2023   INR 0.9 05/18/2021   HGBA1C 5.5 04/20/2020       Assessment & Plan:  Elevated alkaline phosphatase level  Sarcoidosis Assessment & Plan: Dormant sarcoid.  Followed by Dr Theotis.  Breathing stable.    Rash Assessment & Plan: Few circular areas as outlined. No other systemic symptoms. Follow for triggers. Will start antihistamine regularly - allegra as directed. Follow.  Call with update.  Addendum -  She has a history of sarcoid, but she has recently been having intermittent rash, which she was questioning if could be related to apples. This is a separate and new concern not related to her sarcoid. Intermittent rash, trying to confirm etiology. She is monitoring foods, etc. So request allergy referral.        Allena Hamilton, MD

## 2023-12-26 NOTE — Assessment & Plan Note (Addendum)
 Dormant sarcoid.  Followed by Dr Theotis.  Breathing stable.

## 2023-12-29 ENCOUNTER — Encounter: Payer: Self-pay | Admitting: Internal Medicine

## 2023-12-29 NOTE — Assessment & Plan Note (Addendum)
 Few circular areas as outlined. No other systemic symptoms. Follow for triggers. Will start antihistamine regularly - allegra as directed. Follow.  Call with update.  Addendum -  She has a history of sarcoid, but she has recently been having intermittent rash, which she was questioning if could be related to apples. This is a separate and new concern not related to her sarcoid. Intermittent rash, trying to confirm etiology. She is monitoring foods, etc. So request allergy referral.

## 2024-01-01 ENCOUNTER — Encounter: Payer: Self-pay | Admitting: Internal Medicine

## 2024-01-01 ENCOUNTER — Telehealth: Payer: Self-pay

## 2024-01-01 DIAGNOSIS — R21 Rash and other nonspecific skin eruption: Secondary | ICD-10-CM

## 2024-01-01 NOTE — Telephone Encounter (Signed)
 See my chart message - if agreeable to referral to Dr Frutoso.

## 2024-01-01 NOTE — Telephone Encounter (Signed)
 Copied from CRM #[id number]. Topic: Clinical - Medical Advice >> Jan 01, 2024 11:11 AM Diane French wrote: Reason for CRM: Patient is calling in because Dr. Glendia told her at her appointment on 09/26 if the rash has continued and not gotten better to let her know. The patient states it has not gotten any better so she would like to let Dr. Glendia know and see where to go from here, and see if she should see an allergy doctor.

## 2024-01-01 NOTE — Telephone Encounter (Signed)
 Order placed for referral.

## 2024-01-01 NOTE — Telephone Encounter (Signed)
Yes.  If she is agreeable.

## 2024-01-01 NOTE — Telephone Encounter (Signed)
Referral pended for you

## 2024-01-01 NOTE — Telephone Encounter (Signed)
 See my chart message

## 2024-01-02 NOTE — Telephone Encounter (Signed)
 Called patient to let her know that she is going to have to go out of town for a referral to different allergist. Patient is okay with this.

## 2024-01-02 NOTE — Telephone Encounter (Signed)
 Order placed for [facility] - allergy. Pt notfiied via my chart.

## 2024-01-02 NOTE — Addendum Note (Signed)
 Addended by: GLENDIA ALLENA RAMAN on: 01/02/2024 12:28 PM   Modules accepted: Orders

## 2024-01-08 ENCOUNTER — Encounter: Payer: Self-pay | Admitting: Internal Medicine

## 2024-01-09 NOTE — Telephone Encounter (Signed)
 Pt stated that she has not heard from the allergy referral.

## 2024-01-09 NOTE — Telephone Encounter (Signed)
 Reviewed message. Apparently pt wants to come in today to get her vaccines. If she has had not problems receiving these vaccines, then ok to give today if opening on schedule. (Ask usual questions prior to giving). Thanks

## 2024-01-09 NOTE — Telephone Encounter (Signed)
 Spoke with pt and scheduled her for a nurse visit on Monday.

## 2024-01-10 ENCOUNTER — Encounter: Payer: Self-pay | Admitting: Internal Medicine

## 2024-01-12 ENCOUNTER — Ambulatory Visit: Payer: PRIVATE HEALTH INSURANCE

## 2024-01-13 NOTE — Telephone Encounter (Signed)
 Noted

## 2024-02-18 ENCOUNTER — Telehealth: Payer: Self-pay

## 2024-02-18 NOTE — Telephone Encounter (Signed)
 Her appt in 12/2023 was work in for rash. I would like for her to keep f/u appt if agreeable. If desires not to keep, will need to reschedule a follow up appt.  Also, if cancels, block appt - may need to use.

## 2024-02-18 NOTE — Telephone Encounter (Signed)
 Copied from CRM #[id number]. Topic: Appointments - Appointment Info/Confirmation >> Feb 18, 2024  4:30 PM Alexandria E wrote: Reason for CRM: Patient has her 6 month f/u coming up on 11/21, and she was questioning if PCP would still like her to have this visit as she was just in office to see Dr. Glendia on 12/26/2023. Please call patient if this appointment is still needed.

## 2024-02-20 ENCOUNTER — Ambulatory Visit: Payer: PRIVATE HEALTH INSURANCE | Admitting: Internal Medicine

## 2024-02-20 DIAGNOSIS — D869 Sarcoidosis, unspecified: Secondary | ICD-10-CM

## 2024-02-20 DIAGNOSIS — R748 Abnormal levels of other serum enzymes: Secondary | ICD-10-CM

## 2024-02-20 DIAGNOSIS — Z13220 Encounter for screening for lipoid disorders: Secondary | ICD-10-CM
